# Patient Record
Sex: Male | Born: 1965 | Hispanic: No | Marital: Married | Smoking: Former smoker
Health system: Southern US, Community
[De-identification: ages and names within clinical notes are randomized; demographics above are authoritative.]

## PROBLEM LIST (undated history)

## (undated) DIAGNOSIS — K2270 Barrett's esophagus without dysplasia: Secondary | ICD-10-CM

## (undated) DIAGNOSIS — K449 Diaphragmatic hernia without obstruction or gangrene: Secondary | ICD-10-CM

## (undated) DIAGNOSIS — D126 Benign neoplasm of colon, unspecified: Secondary | ICD-10-CM

## (undated) DIAGNOSIS — K219 Gastro-esophageal reflux disease without esophagitis: Secondary | ICD-10-CM

## (undated) HISTORY — PX: COLONOSCOPY: SHX174

## (undated) HISTORY — PX: VASECTOMY: SHX75

## (undated) HISTORY — DX: Barrett's esophagus without dysplasia: K22.70

## (undated) HISTORY — PX: UPPER GASTROINTESTINAL ENDOSCOPY: SHX188

## (undated) HISTORY — DX: Benign neoplasm of colon, unspecified: D12.6

## (undated) HISTORY — DX: Gastro-esophageal reflux disease without esophagitis: K21.9

## (undated) HISTORY — DX: Diaphragmatic hernia without obstruction or gangrene: K44.9

---

## 2000-05-22 ENCOUNTER — Other Ambulatory Visit: Admission: RE | Admit: 2000-05-22 | Discharge: 2000-05-22 | Payer: Self-pay | Admitting: Urology

## 2002-09-12 ENCOUNTER — Emergency Department (HOSPITAL_COMMUNITY): Admission: EM | Admit: 2002-09-12 | Discharge: 2002-09-12 | Payer: Self-pay | Admitting: *Deleted

## 2006-10-13 ENCOUNTER — Emergency Department (HOSPITAL_COMMUNITY): Admission: EM | Admit: 2006-10-13 | Discharge: 2006-10-13 | Payer: Self-pay | Admitting: Emergency Medicine

## 2007-01-26 ENCOUNTER — Encounter (INDEPENDENT_AMBULATORY_CARE_PROVIDER_SITE_OTHER): Payer: Self-pay | Admitting: *Deleted

## 2007-01-26 ENCOUNTER — Ambulatory Visit (HOSPITAL_COMMUNITY): Admission: RE | Admit: 2007-01-26 | Discharge: 2007-01-26 | Payer: Self-pay | Admitting: Internal Medicine

## 2007-02-02 ENCOUNTER — Encounter: Payer: Self-pay | Admitting: Internal Medicine

## 2007-02-10 ENCOUNTER — Ambulatory Visit (HOSPITAL_COMMUNITY): Admission: RE | Admit: 2007-02-10 | Discharge: 2007-02-10 | Payer: Self-pay | Admitting: *Deleted

## 2007-02-10 ENCOUNTER — Encounter (INDEPENDENT_AMBULATORY_CARE_PROVIDER_SITE_OTHER): Payer: Self-pay | Admitting: *Deleted

## 2007-03-24 ENCOUNTER — Encounter: Payer: Self-pay | Admitting: Internal Medicine

## 2007-04-15 ENCOUNTER — Ambulatory Visit (HOSPITAL_COMMUNITY): Admission: RE | Admit: 2007-04-15 | Discharge: 2007-04-15 | Payer: Self-pay | Admitting: *Deleted

## 2007-04-15 ENCOUNTER — Encounter (INDEPENDENT_AMBULATORY_CARE_PROVIDER_SITE_OTHER): Payer: Self-pay | Admitting: *Deleted

## 2007-05-29 ENCOUNTER — Encounter: Payer: Self-pay | Admitting: Internal Medicine

## 2008-09-08 ENCOUNTER — Encounter: Payer: Self-pay | Admitting: Internal Medicine

## 2009-01-11 DIAGNOSIS — K648 Other hemorrhoids: Secondary | ICD-10-CM | POA: Insufficient documentation

## 2009-01-11 DIAGNOSIS — K449 Diaphragmatic hernia without obstruction or gangrene: Secondary | ICD-10-CM | POA: Insufficient documentation

## 2009-01-11 DIAGNOSIS — Z860100 Personal history of colon polyps, unspecified: Secondary | ICD-10-CM | POA: Insufficient documentation

## 2009-01-11 DIAGNOSIS — K2270 Barrett's esophagus without dysplasia: Secondary | ICD-10-CM | POA: Insufficient documentation

## 2009-01-17 ENCOUNTER — Ambulatory Visit: Payer: Self-pay | Admitting: Internal Medicine

## 2009-01-27 ENCOUNTER — Encounter: Payer: Self-pay | Admitting: Internal Medicine

## 2009-01-31 ENCOUNTER — Ambulatory Visit: Payer: Self-pay | Admitting: Internal Medicine

## 2009-02-04 ENCOUNTER — Encounter: Payer: Self-pay | Admitting: Internal Medicine

## 2009-12-06 ENCOUNTER — Telehealth (INDEPENDENT_AMBULATORY_CARE_PROVIDER_SITE_OTHER): Payer: Self-pay

## 2010-04-17 NOTE — Progress Notes (Signed)
  Phone Note Other Incoming   Request: Send information Summary of Call: Request of records received from [place] of the [employer]. Forwarded to [employer].

## 2010-07-31 NOTE — Op Note (Signed)
NAME:  Tanner Kane, WEINKAUF NO.:  [id number]   MEDICAL RECORD NO.:  [id number]          PATIENT TYPE:  AMB   LOCATION:  ENDO                         FACILITY:  [facility]   PHYSICIAN:  Georgiana Spinner, M.D.    DATE OF BIRTH:  1965/12/10   DATE OF PROCEDURE:  02/10/2007  DATE OF DISCHARGE:                               OPERATIVE REPORT   PROCEDURE:  Upper endoscopy.   INDICATIONS:  Gastroesophageal reflux disease.   ANESTHESIA:  Fentanyl 100 mcg, Versed 10 mg.   PROCEDURE:  With the patient mildly sedated in the left lateral  decubitus position, the Pentax videoscopic endoscope was inserted and  passed under direct vision through the esophagus which appeared normal  until we reached the distal esophagus.  There was an area of Barrett's  photographed and biopsied above a hiatal hernia.  We then entered into  the stomach.  Fundus, body, antrum, duodenal bulb, second portion of the  duodenum were visualized.  From this point the endoscope was slowly  withdrawn taking circumferential views of duodenal mucosa until the  endoscope had been pulled back into stomach and  placed in retroflexion  to view the stomach from below.  The endoscope was straightened and withdrawn taking circumferential  views of the remaining gastric and esophageal mucosa.  The patient's  vital signs and pulse oximeter remained stable.  The patient tolerated  procedure well without apparent complications.   FINDINGS:  Hiatal hernia with Barrett's esophagus and loose wrap of the  GE junction around the endoscope, indicating laxity of the lower  esophageal sphincter.   PLAN:  Continue the patient on PPI therapy.  May want to increase dose  to b.i.d.  Will discuss dietary measures with the patient and wife and  have patient follow up with me as an outpatient.           ______________________________  Georgiana Spinner, M.D.     GMO/MEDQ  D:  02/10/2007  T:  02/10/2007  Job:  [id number]

## 2010-07-31 NOTE — Op Note (Signed)
NAME:  Tanner Kane, Tanner Kane NO.:  [id number]   MEDICAL RECORD NO.:  [id number]          PATIENT TYPE:  AMB   LOCATION:  ENDO                         FACILITY:  [facility]   PHYSICIAN:  Georgiana Spinner, M.D.    DATE OF BIRTH:  [birth year]   DATE OF PROCEDURE:  04/15/2007  DATE OF DISCHARGE:                               OPERATIVE REPORT   PROCEDURE:  Colonoscopy.   INDICATIONS:  Rectal bleeding.   ANESTHESIA:  Fentanyl 125 mcg, Versed 10 mg, Phenergan 25 mg.   PROCEDURE:  With the patient mildly sedated in the left lateral  decubitus position, the Pentax videoscopic colonoscope was inserted in  the rectum after normal rectal exam was performed, passed under direct  vision to the cecum, identified by crow's foot of the cecum and  ileocecal valve, both of which were photographed.  From this point, the  colonoscope was slowly withdrawn taking circumferential views of the  colonic mucosa, stopping first in the transverse colon where a small  polyp was seen, photographed and removed using snare cautery technique  setting of 20/150 blended current.  We next stopped at approximately 20  cm from anal verge at which point a larger polyp was seen with cherry-  red color to it which may very well have been the cause of patient's  bleeding.  This was photographed and this too was removed using snare  cautery technique with the same setting.  It was retrieved for pathology  and a third polyp that was sitting adjacent to this one was removed  using hot biopsy forceps technique with the same setting.  All the  tissue was retrieved for pathology.  The endoscope was withdrawn to the  rectum, which appeared normal on direct and showed hemorrhoids on  retroflexed view.  The endoscope was straightened and withdrawn.  The  patient's vital signs and pulse oximeter remained stable.  The patient  tolerated the procedure well without apparent complications.   FINDINGS:  Internal hemorrhoids.   Polyps at 20 cm from the anal verge  and anal polyp at the transverse colon.   PLAN:  Await biopsy reports.  The patient will call me for results and  follow-up with me as an outpatient as needed.           ______________________________  Georgiana Spinner, M.D.     GMO/MEDQ  D:  04/15/2007  T:  04/15/2007  Job:  [id number]

## 2010-11-11 ENCOUNTER — Other Ambulatory Visit: Payer: Self-pay | Admitting: Internal Medicine

## 2011-02-12 ENCOUNTER — Other Ambulatory Visit: Payer: Self-pay | Admitting: Internal Medicine

## 2011-02-12 ENCOUNTER — Telehealth: Payer: Self-pay | Admitting: Internal Medicine

## 2011-02-12 MED ORDER — PANTOPRAZOLE SODIUM 40 MG PO TBEC: 40.0000 mg | DELAYED_RELEASE_TABLET | Freq: Every day | ORAL | Status: DC

## 2011-02-12 NOTE — Telephone Encounter (Signed)
rx sent

## 2011-02-26 ENCOUNTER — Encounter: Payer: Self-pay | Admitting: *Deleted

## 2011-03-05 ENCOUNTER — Ambulatory Visit (INDEPENDENT_AMBULATORY_CARE_PROVIDER_SITE_OTHER): Payer: BC Managed Care – PPO | Admitting: Internal Medicine

## 2011-03-05 ENCOUNTER — Encounter: Payer: Self-pay | Admitting: Internal Medicine

## 2011-03-05 VITALS — BP 134/76 | HR 60 | Ht 72.0 in | Wt 176.0 lb

## 2011-03-05 DIAGNOSIS — Z860100 Personal history of colon polyps, unspecified: Secondary | ICD-10-CM

## 2011-03-05 DIAGNOSIS — K2270 Barrett's esophagus without dysplasia: Secondary | ICD-10-CM

## 2011-03-05 MED ORDER — PANTOPRAZOLE SODIUM 40 MG PO TBEC: 40.0000 mg | DELAYED_RELEASE_TABLET | Freq: Every day | ORAL | Status: DC

## 2011-03-05 NOTE — Patient Instructions (Addendum)
You will be due for a recall endoscopy/colonoscopy in 03/2012. We will send you a reminder in the mail when it gets closer to that time. We have sent the following medications to your pharmacy for you to pick up at your convenience: Protonix CC: Dr Ricki Miller

## 2011-03-05 NOTE — Progress Notes (Signed)
Tanner Kane 09-02-1965 MRN [id number]    History of Present Illness:  This is a 45 year old white male with gastroesophageal reflux. He is a former patient of Dr. Virginia Rochester, last seen in August 2010. He is here to refill protonix 40 mg daily. He had an upper endoscopy in November 2010 which showed reflux esophagitis. A prior upper endoscopy in 2008 showed intestinal metaplasia consistent with Barrett's esophagus. 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 He currently denies any dysphagia. He takes Protonix 40 mg daily with complete relief of his symptoms. He denies cough or hoarseness. He continues to smoke. There is a personal history of adenomatous polyps of the colon, therefore, he would be due for a repeat colonoscopy in January 2014.   Past Medical History  Diagnosis Date  . Barrett esophagus   . Adenomatous colon polyp   . Hiatal hernia   . GERD (gastroesophageal reflux disease)    Past Surgical History  Procedure Date  . Vasectomy     reports that he has been smoking.  He has never used smokeless tobacco. He reports that he drinks alcohol. He reports that he does not use illicit drugs. family history includes Bone cancer in his maternal grandfather; Breast cancer in his maternal grandfather; and Heart disease in his maternal grandmother.  There is no history of Colon cancer. Allergies  Allergen Reactions  . Aspirin         Review of Systems: Denies chest pain shortness of breath abdominal pain change in bowel habits  The remainder of the 10 point ROS is negative except as outlined in H&P   Physical Exam: General appearance  Well developed, in no distress. Eyes- non icteric. HEENT nontraumatic, normocephalic. Mouth no lesions, tongue papillated, no cheilosis. Neck supple without adenopathy, thyroid not enlarged, no carotid bruits, no JVD. Lungs Clear to auscultation bilaterally. Cor normal S1, normal  S2, regular rhythm, no murmur,  quiet precordium. Abdomen:  Rectal: Extremities no pedal edema. Skin no lesions. Neurological alert and oriented x 3. Psychological normal mood and affect.  Assessment and Plan:   Problem #1 History of chronic gastroesophageal reflux with complete symptomatic relief on Protonix 40 mg daily. Patient has a history of Barrett's esophagus on a 2008 endoscopy but this was not reproduced on a repeat endoscopy in April 2010. We will refill his Protonix and will repeat his upper endoscopy when he is due for colonoscopy in January 2014.  Problem #2 History of adenomatous polyps of the colon on his last colonoscopy in 2009. He will be due for a recall colonoscopy in January 2014.   03/05/2011 Lina Sar

## 2012-01-02 ENCOUNTER — Other Ambulatory Visit: Payer: Self-pay | Admitting: Internal Medicine

## 2012-01-14 ENCOUNTER — Other Ambulatory Visit: Payer: Self-pay | Admitting: Internal Medicine

## 2012-04-09 ENCOUNTER — Other Ambulatory Visit: Payer: Self-pay | Admitting: *Deleted

## 2012-04-09 MED ORDER — PANTOPRAZOLE SODIUM 40 MG PO TBEC: DELAYED_RELEASE_TABLET | ORAL | Status: DC

## 2012-04-17 ENCOUNTER — Encounter: Payer: Self-pay | Admitting: Internal Medicine

## 2012-04-23 ENCOUNTER — Encounter: Payer: Self-pay | Admitting: Internal Medicine

## 2012-05-26 ENCOUNTER — Ambulatory Visit (AMBULATORY_SURGERY_CENTER): Payer: BC Managed Care – PPO | Admitting: *Deleted

## 2012-05-26 VITALS — Ht 73.0 in | Wt 178.4 lb

## 2012-05-26 DIAGNOSIS — Z1211 Encounter for screening for malignant neoplasm of colon: Secondary | ICD-10-CM

## 2012-05-26 DIAGNOSIS — Z86010 Personal history of colonic polyps: Secondary | ICD-10-CM

## 2012-05-26 DIAGNOSIS — K2270 Barrett's esophagus without dysplasia: Secondary | ICD-10-CM

## 2012-05-26 MED ORDER — MOVIPREP 100 G PO SOLR: ORAL | Status: DC

## 2012-05-26 NOTE — Progress Notes (Signed)
Pt states at the beginning of his PV that he thought he was having an EGD only.  I showed him the recall letter sent to him and he calls his wife to check with her.  She has several questions about insurance, so I gave her the number of Karolee Stamps, who handles the insurance.  I prepared him for both procedures and he will call back to decide if he is going to have both procedures or only the EGD.  Understanding voiced

## 2012-05-27 ENCOUNTER — Telehealth: Payer: Self-pay | Admitting: Internal Medicine

## 2012-05-27 ENCOUNTER — Encounter: Payer: Self-pay | Admitting: Internal Medicine

## 2012-05-27 NOTE — Telephone Encounter (Signed)
OK, please cancel his colon so we can use the time on the schedule to add on another procedure. thanx DB

## 2012-05-27 NOTE — Telephone Encounter (Signed)
Left a message for patient to call me. 

## 2012-05-27 NOTE — Telephone Encounter (Signed)
Spoke with patient and he states his insurance will not pay for the endo/colon done together. He wants to do the EGD and then call back to schedule the colonoscopy in a month or two. Colonoscopy removed from his procedure appointment. Note to remind patient in two months, that he should scheduled colonoscopy.

## 2012-05-27 NOTE — Telephone Encounter (Signed)
Changed pt's appt to EGD only per his request.

## 2012-06-09 ENCOUNTER — Encounter: Payer: Self-pay | Admitting: Internal Medicine

## 2012-06-09 ENCOUNTER — Ambulatory Visit (AMBULATORY_SURGERY_CENTER): Payer: BC Managed Care – PPO | Admitting: Internal Medicine

## 2012-06-09 VITALS — BP 124/83 | HR 56 | Temp 97.2°F | Resp 22 | Ht 72.0 in | Wt 175.0 lb

## 2012-06-09 DIAGNOSIS — K209 Esophagitis, unspecified without bleeding: Secondary | ICD-10-CM

## 2012-06-09 DIAGNOSIS — K2270 Barrett's esophagus without dysplasia: Secondary | ICD-10-CM

## 2012-06-09 MED ORDER — SODIUM CHLORIDE 0.9 % IV SOLN: 500.0000 mL | INTRAVENOUS | Status: DC

## 2012-06-09 NOTE — Progress Notes (Signed)
Patient did not experience any of the following events: a burn prior to discharge; a fall within the facility; wrong site/side/patient/procedure/implant event; or a hospital transfer or hospital admission upon discharge from the facility. (G8907) Patient did not have preoperative order for IV antibiotic SSI prophylaxis. (G8918)  

## 2012-06-09 NOTE — Patient Instructions (Addendum)
YOU HAD AN ENDOSCOPIC PROCEDURE TODAY AT THE [facility]: Refer to the procedure report that was given to you for any specific questions about what was found during the examination.  If the procedure report does not answer your questions, please call your gastroenterologist to clarify.  If you requested that your care partner not be given the details of your procedure findings, then the procedure report has been included in a sealed envelope for you to review at your convenience later.  YOU SHOULD EXPECT: Some feelings of bloating in the abdomen. Passage of more gas than usual.  Walking can help get rid of the air that was put into your GI tract during the procedure and reduce the bloating. If you had a lower endoscopy (such as a colonoscopy or flexible sigmoidoscopy) you may notice spotting of blood in your stool or on the toilet paper. If you underwent a bowel prep for your procedure, then you may not have a normal bowel movement for a few days.  DIET: Your first meal following the procedure should be a light meal and then it is ok to progress to your normal diet.  A half-sandwich or bowl of soup is an example of a good first meal.  Heavy or fried foods are harder to digest and may make you feel nauseous or bloated.  Likewise meals heavy in dairy and vegetables can cause extra gas to form and this can also increase the bloating.  Drink plenty of fluids but you should avoid alcoholic beverages for 24 hours.  ACTIVITY: Your care partner should take you home directly after the procedure.  You should plan to take it easy, moving slowly for the rest of the day.  You can resume normal activity the day after the procedure however you should NOT DRIVE or use heavy machinery for 24 hours (because of the sedation medicines used during the test).    SYMPTOMS TO REPORT IMMEDIATELY: A gastroenterologist can be reached at any hour.  During normal business hours, 8:30 AM to 5:00 PM Monday through Friday,  call [phone].  After hours and on weekends, please call the GI answering service at [phone] who will take a message and have the physician on call contact you.    Following upper endoscopy (EGD)  Vomiting of blood or coffee ground material  New chest pain or pain under the shoulder blades  Painful or persistently difficult swallowing  New shortness of breath  Fever of 100F or higher  Black, tarry-looking stools  FOLLOW UP: If any biopsies were taken you will be contacted by phone or by letter within the next 1-3 weeks.  Call your gastroenterologist if you have not heard about the biopsies in 3 weeks.  Our staff will call the home number listed on your records the next business day following your procedure to check on you and address any questions or concerns that you may have at that time regarding the information given to you following your procedure. This is a courtesy call and so if there is no answer at the home number and we have not heard from you through the emergency physician on call, we will assume that you have returned to your regular daily activities without incident.  SIGNATURES/CONFIDENTIALITY: You and/or your care partner have signed paperwork which will be entered into your electronic medical record.  These signatures attest to the fact that that the information above on your After Visit Summary has been reviewed and is understood.  Full   responsibility of the confidentiality of this discharge information lies with you and/or your care-partner.   Hiatal hernia information given.  Await biopsies, continue PPI and anti reflux regimen.

## 2012-06-09 NOTE — Op Note (Signed)
[facility] [address] [place], [address]   ENDOSCOPY PROCEDURE REPORT  PATIENT: Tanner Kane, Tanner Kane.  MR#: [id number] BIRTHDATE: Jul 09, 1965 , 47  yrs. old GENDER: Male ENDOSCOPIST: Hart Carwin, MD REFERRED BY:  recall EGD PROCEDURE DATE:  06/09/2012 PROCEDURE:  EGD w/ biopsy ASA CLASS:     Class I INDICATIONS:  history of Barrett's esophagus.   EGD 2008- Barrett's esophagus, EGD 2011- no Barrett's, Pt taking Protonix daily, has no symptoms. MEDICATIONS: MAC sedation, administered by CRNA and propofol (Diprivan) 200mg  IV TOPICAL ANESTHETIC: none  DESCRIPTION OF PROCEDURE: After the risks benefits and alternatives of the procedure were thoroughly explained, informed consent was obtained.  The LB GIF-H180 [id number] endoscope was introduced through the mouth and advanced to the second portion of the duodenum. Without limitations.  The instrument was slowly withdrawn as the mucosa was fully examined.        ESOPHAGUS: A 2 cm hiatal hernia was noted.   The mucosa of the esophagus appeared normal.  A biopsy was performed.  STOMACH: The mucosa of the stomach appeared normal.  DUODENUM: The duodenal mucosa showed no abnormalities. from g-e junction Retroflexed views revealed no abnormalities.     The scope was then withdrawn from the patient and the procedure completed.  COMPLICATIONS: There were no complications. ENDOSCOPIC IMPRESSION: 1.   2 cm hiatal hernia 2.   The mucosa of the esophagus appeared normal; biopsy from z-line taken 3.   The mucosa of the stomach appeared normal 4.   The duodenal mucosa showed no abnormalities  RECOMMENDATIONS: 1.  Await pathology results 2.  Anti-reflux regimen to be follow 3.  Continue PPI  REPEAT EXAM: for EGD pending biopsy results.  eSigned:  Hart Carwin, MD 06/09/2012 9:05 AM   CC:  PATIENT NAME:  [name]. MR#: [id number]

## 2012-06-10 ENCOUNTER — Telehealth: Payer: Self-pay

## 2012-06-10 NOTE — Telephone Encounter (Signed)
  Follow up Call-  Call back number 06/09/2012  Post procedure Call Back phone  # [phone]  Permission to leave phone message Yes     Patient questions:  Do you have a fever, pain , or abdominal swelling? no Pain Score  0 *  Have you tolerated food without any problems? yes  Have you been able to return to your normal activities? no  Do you have any questions about your discharge instructions: Diet   no Medications  no Follow up visit  no  Do you have questions or concerns about your Care? no  Actions: * If pain score is 4 or above: No action needed, pain <4.

## 2012-06-15 ENCOUNTER — Encounter: Payer: Self-pay | Admitting: Internal Medicine

## 2012-06-16 ENCOUNTER — Encounter: Payer: Self-pay | Admitting: Internal Medicine

## 2012-07-08 ENCOUNTER — Other Ambulatory Visit: Payer: Self-pay | Admitting: Internal Medicine

## 2012-07-29 ENCOUNTER — Ambulatory Visit (AMBULATORY_SURGERY_CENTER): Payer: BC Managed Care – PPO | Admitting: *Deleted

## 2012-07-29 VITALS — Ht 72.0 in | Wt 171.0 lb

## 2012-07-29 DIAGNOSIS — Z1211 Encounter for screening for malignant neoplasm of colon: Secondary | ICD-10-CM

## 2012-07-29 DIAGNOSIS — Z86010 Personal history of colonic polyps: Secondary | ICD-10-CM

## 2012-07-29 MED ORDER — MOVIPREP 100 G PO SOLR: ORAL | Status: DC

## 2012-07-30 ENCOUNTER — Encounter: Payer: Self-pay | Admitting: Internal Medicine

## 2012-08-12 ENCOUNTER — Ambulatory Visit (AMBULATORY_SURGERY_CENTER): Payer: BC Managed Care – PPO | Admitting: Internal Medicine

## 2012-08-12 ENCOUNTER — Encounter: Payer: Self-pay | Admitting: Internal Medicine

## 2012-08-12 VITALS — BP 121/81 | HR 56 | Temp 97.8°F | Resp 12 | Ht 72.0 in | Wt 171.0 lb

## 2012-08-12 DIAGNOSIS — D126 Benign neoplasm of colon, unspecified: Secondary | ICD-10-CM

## 2012-08-12 DIAGNOSIS — Z86010 Personal history of colonic polyps: Secondary | ICD-10-CM

## 2012-08-12 DIAGNOSIS — Z1211 Encounter for screening for malignant neoplasm of colon: Secondary | ICD-10-CM

## 2012-08-12 MED ORDER — SODIUM CHLORIDE 0.9 % IV SOLN: 500.0000 mL | INTRAVENOUS | Status: DC

## 2012-08-12 NOTE — Progress Notes (Signed)
Called to room to assist during endoscopic procedure.  Patient ID and intended procedure confirmed with present staff. Received instructions for my participation in the procedure from the performing physician.  

## 2012-08-12 NOTE — Patient Instructions (Addendum)
Discharge instructions given with verbal understanding. Handout on polyps given. Resume previous medications. YOU HAD AN ENDOSCOPIC PROCEDURE TODAY AT THE [facility]: Refer to the procedure report that was given to you for any specific questions about what was found during the examination.  If the procedure report does not answer your questions, please call your gastroenterologist to clarify.  If you requested that your care partner not be given the details of your procedure findings, then the procedure report has been included in a sealed envelope for you to review at your convenience later.  YOU SHOULD EXPECT: Some feelings of bloating in the abdomen. Passage of more gas than usual.  Walking can help get rid of the air that was put into your GI tract during the procedure and reduce the bloating. If you had a lower endoscopy (such as a colonoscopy or flexible sigmoidoscopy) you may notice spotting of blood in your stool or on the toilet paper. If you underwent a bowel prep for your procedure, then you may not have a normal bowel movement for a few days.  DIET: Your first meal following the procedure should be a light meal and then it is ok to progress to your normal diet.  A half-sandwich or bowl of soup is an example of a good first meal.  Heavy or fried foods are harder to digest and may make you feel nauseous or bloated.  Likewise meals heavy in dairy and vegetables can cause extra gas to form and this can also increase the bloating.  Drink plenty of fluids but you should avoid alcoholic beverages for 24 hours.  ACTIVITY: Your care partner should take you home directly after the procedure.  You should plan to take it easy, moving slowly for the rest of the day.  You can resume normal activity the day after the procedure however you should NOT DRIVE or use heavy machinery for 24 hours (because of the sedation medicines used during the test).    SYMPTOMS TO REPORT IMMEDIATELY: A  gastroenterologist can be reached at any hour.  During normal business hours, 8:30 AM to 5:00 PM Monday through Friday, call [phone].  After hours and on weekends, please call the GI answering service at [phone] who will take a message and have the physician on call contact you.   Following lower endoscopy (colonoscopy or flexible sigmoidoscopy):  Excessive amounts of blood in the stool  Significant tenderness or worsening of abdominal pains  Swelling of the abdomen that is new, acute  Fever of 100F or higher  FOLLOW UP: If any biopsies were taken you will be contacted by phone or by letter within the next 1-3 weeks.  Call your gastroenterologist if you have not heard about the biopsies in 3 weeks.  Our staff will call the home number listed on your records the next business day following your procedure to check on you and address any questions or concerns that you may have at that time regarding the information given to you following your procedure. This is a courtesy call and so if there is no answer at the home number and we have not heard from you through the emergency physician on call, we will assume that you have returned to your regular daily activities without incident.  SIGNATURES/CONFIDENTIALITY: You and/or your care partner have signed paperwork which will be entered into your electronic medical record.  These signatures attest to the fact that that the information above on your After Visit Summary has   been reviewed and is understood.  Full responsibility of the confidentiality of this discharge information lies with you and/or your care-partner. 

## 2012-08-12 NOTE — Progress Notes (Signed)
Patient did not experience any of the following events: a burn prior to discharge; a fall within the facility; wrong site/side/patient/procedure/implant event; or a hospital transfer or hospital admission upon discharge from the facility. (G8907) Patient did not have preoperative order for IV antibiotic SSI prophylaxis. (G8918)  

## 2012-08-12 NOTE — Op Note (Signed)
[facility] [address] [place], [address]   COLONOSCOPY PROCEDURE REPORT  PATIENT: Tanner Kane, Tanner Kane.  MR#: [id number] BIRTHDATE: 1965/03/26 , 47  yrs. old GENDER: Male ENDOSCOPIST: Hart Carwin, MD REFERRED BY:  recall colonoscopy PROCEDURE DATE:  08/12/2012 PROCEDURE:   Colonoscopy with snare polypectomy and Colonoscopy with cold biopsy polypectomy ASA CLASS:   Class II INDICATIONS:Patient's personal history of adenomatous colon polyps and last colon 2010. MEDICATIONS: MAC sedation, administered by CRNA and propofol (Diprivan) 300mg  IV  DESCRIPTION OF PROCEDURE:   After the risks and benefits and of the procedure were explained, informed consent was obtained.  A digital rectal exam revealed no abnormalities of the rectum.    The LB PFC-H190 [id number]  endoscope was introduced through the anus and advanced to the cecum, which was identified by both the appendix and ileocecal valve .  The quality of the prep was excellent, using MoviPrep .  The instrument was then slowly withdrawn as the colon was fully examined.     COLON FINDINGS: Four smooth sessile polyps ranging between 5-68mm in size were found in the descending colon, two at 80 cm, and 2 around 50 cm.  A polypectomy was performed using snare cautery, with a cold snare and with cold forceps.  The resection was complete and the polyp tissue was completely retrieved.     Retroflexed views revealed no abnormalities.     The scope was then withdrawn from the patient and the procedure completed.  COMPLICATIONS: There were no complications. ENDOSCOPIC IMPRESSION: Four sessile polyps ranging between 5-46mm in size were found in the descending colon; polypectomy was performed using snare cautery, with a cold snare and with cold forceps  RECOMMENDATIONS: Await pathology results no ASA x 2 weeks high fiber diet   REPEAT EXAM: In 3 year(s)  for  Colonoscopy.  cc:  _______________________________ eSignedHart Carwin, MD 08/12/2012 10:53 AM     PATIENT NAME:  Tanner Kane. MR#: [id number]

## 2012-08-12 NOTE — Progress Notes (Signed)
1128 atropine .5mg  iv stat decreased hr

## 2012-08-13 ENCOUNTER — Telehealth: Payer: Self-pay | Admitting: *Deleted

## 2012-08-13 NOTE — Telephone Encounter (Signed)
  Follow up Call-  Call back number 08/12/2012 06/09/2012  Post procedure Call Back phone  # [phone]  Permission to leave phone message Yes Yes     Patient questions:  Do you have a fever, pain , or abdominal swelling? no Pain Score  0 *  Have you tolerated food without any problems? yes  Have you been able to return to your normal activities? yes  Do you have any questions about your discharge instructions: Diet   no Medications  no Follow up visit  no  Do you have questions or concerns about your Care? no  Actions: * If pain score is 4 or above: No action needed, pain <4.

## 2012-08-14 ENCOUNTER — Encounter: Payer: Self-pay | Admitting: Internal Medicine

## 2012-08-14 ENCOUNTER — Ambulatory Visit (INDEPENDENT_AMBULATORY_CARE_PROVIDER_SITE_OTHER): Payer: BC Managed Care – PPO | Admitting: Internal Medicine

## 2012-08-14 ENCOUNTER — Other Ambulatory Visit (INDEPENDENT_AMBULATORY_CARE_PROVIDER_SITE_OTHER): Payer: BC Managed Care – PPO

## 2012-08-14 ENCOUNTER — Telehealth: Payer: Self-pay | Admitting: Internal Medicine

## 2012-08-14 VITALS — BP 120/80 | HR 60 | Ht 72.0 in | Wt 171.5 lb

## 2012-08-14 DIAGNOSIS — R1032 Left lower quadrant pain: Secondary | ICD-10-CM

## 2012-08-14 DIAGNOSIS — Z9889 Other specified postprocedural states: Secondary | ICD-10-CM

## 2012-08-14 LAB — CBC WITH DIFFERENTIAL/PLATELET
Basophils Relative: 0.3 % (ref 0.0–3.0)
Eosinophils Absolute: 0.1 10*3/uL (ref 0.0–0.7)
Eosinophils Relative: 0.9 % (ref 0.0–5.0)
Lymphocytes Relative: 22.4 % (ref 12.0–46.0)
Neutrophils Relative %: 67.7 % (ref 43.0–77.0)
RBC: 5.18 Mil/uL (ref 4.22–5.81)
WBC: 9.2 10*3/uL (ref 4.5–10.5)

## 2012-08-14 MED ORDER — CIPROFLOXACIN HCL 250 MG PO TABS: 250.0000 mg | ORAL_TABLET | Freq: Two times a day (BID) | ORAL | Status: DC

## 2012-08-14 NOTE — Patient Instructions (Addendum)
We have sent the following medications to your pharmacy for you to pick up at your convenience: Cipro to take one tablet by mouth twice daily x 5 days.   Please call Dr. Christella Hartigan (on call doctor) if pain gets any worse.

## 2012-08-14 NOTE — Progress Notes (Signed)
Tanner Kane [birth year] MRN [id number]        History of Present Illness:  This is a 47 year old white male who underwent screening colonoscopy for followup of colon polyps on  May 28, 2 days ago, and had 4 colon polyps removed.3 polyps were removed with cold snare and one with hot snare. He developed left  lower quadrant abdominal discomfort yesterday while at work and it  has continued to bother him through the night and especially this morning. He denies any fever. He had a normal bowel movement this morning and had regular meal for lunch. He denies distention. On deep pressure the discomfort gets actually better.   Past Medical History  Diagnosis Date  . Barrett esophagus   . Adenomatous colon polyp   . Hiatal hernia   . GERD (gastroesophageal reflux disease)    Past Surgical History  Procedure Laterality Date  . Vasectomy    . Colonoscopy    . Upper gastrointestinal endoscopy      reports that he has been smoking.  He has never used smokeless tobacco. He reports that  drinks alcohol. He reports that he does not use illicit drugs. family history includes Bone cancer in his maternal grandfather; Breast cancer in his maternal grandfather; and Heart disease in his maternal grandmother.  There is no history of Colon cancer, and Esophageal cancer, and Stomach cancer, and Rectal cancer, . Allergies  Allergen Reactions  . Aspirin Other (See Comments)    "thins blood,nose bleeds"        Review of Systems: Denies rectal bleeding fever nausea vomiting  The remainder of the 10 point ROS is negative except as outlined in H&P   Physical Exam: General appearance  Well developed, in no distress. Eyes- non icteric. HEENT nontraumatic, normocephalic. Mouth no lesions, tongue papillated, no cheilosis. Neck supple without adenopathy, thyroid not enlarged, no carotid bruits, no JVD. Lungs Clear to auscultation bilaterally. Cor normal S1, normal S2, regular rhythm, no murmur,   quiet precordium. Abdomen: Soft abdomen with quiet bowel sounds. No rebound. Minimal tenderness on deep pressure over the left lower quadrant. Rest of the abdomen is unremarkable Rectal: Not repeated Extremities no pedal edema. Skin no lesions. Neurological alert and oriented x 3. Psychological normal mood and affect.  Assessment and Plan:  Post procedure left lower quadrant abdominal discomfort without signs of peritonitis. Patien thad  4 polyps removed from the left colon 48 hours ago. 3 of them with cold snare and 1 with a hot snare. His exam is benign. His white cell count is 9200 and he has been able to eat lunch today. I have advised him to stay on light food and liquids for 24 hours and put him on Cipro 250 mg twice a day.. Have discussed possibility of CT scan of the abdomen to look for inflammatory changes in the left colon but  his exam and symptoms are so mild to that we decided not  To proceed with CT scan and wait instead. He may call Dr. Christella Hartigan who is on call this weekend if pain becomes worse.   08/14/2012 Lina Sar

## 2012-08-14 NOTE — Telephone Encounter (Signed)
Patient had colonoscopy on 08/12/12. He states he was doing fine until yesterday. Started with a sharp, stabbing pain in LLQ of abdomen. Pain has increased today. It is decreased when he pushes on abdomen. Rates pain at 5/6. Denies fever, diarrhea or constipation. He is passing gas. Please, advise.

## 2012-08-14 NOTE — Telephone Encounter (Signed)
Patient scheduled for 2:30 PM. He will come for lab prior to OV.

## 2012-08-14 NOTE — Telephone Encounter (Signed)
He should be seen this afternoon in the office. He had 4 polyps in descending colon. Will need STAT CBC

## 2012-08-17 ENCOUNTER — Encounter: Payer: Self-pay | Admitting: Internal Medicine

## 2012-12-29 ENCOUNTER — Other Ambulatory Visit: Payer: Self-pay | Admitting: Internal Medicine

## 2013-06-24 ENCOUNTER — Other Ambulatory Visit: Payer: Self-pay | Admitting: Internal Medicine

## 2013-12-16 ENCOUNTER — Other Ambulatory Visit: Payer: Self-pay | Admitting: Internal Medicine

## 2014-06-17 ENCOUNTER — Other Ambulatory Visit: Payer: Self-pay | Admitting: Internal Medicine

## 2014-06-17 NOTE — Telephone Encounter (Signed)
Sent Rx for pantoprazole (PROTONIX), 40 mg, #90 with one refill to [facility] in [place], [place] on 06/17/14.

## 2014-12-07 ENCOUNTER — Emergency Department: Payer: BLUE CROSS/BLUE SHIELD

## 2014-12-07 ENCOUNTER — Emergency Department
Admission: EM | Admit: 2014-12-07 | Discharge: 2014-12-07 | Disposition: A | Discharge status: Home / Self Care | Payer: BLUE CROSS/BLUE SHIELD | Attending: Emergency Medicine | Admitting: Emergency Medicine

## 2014-12-07 ENCOUNTER — Encounter: Payer: Self-pay | Admitting: *Deleted

## 2014-12-07 DIAGNOSIS — R001 Bradycardia, unspecified: Secondary | ICD-10-CM | POA: Insufficient documentation

## 2014-12-07 DIAGNOSIS — J159 Unspecified bacterial pneumonia: Secondary | ICD-10-CM | POA: Diagnosis not present

## 2014-12-07 DIAGNOSIS — Z720 Tobacco use: Secondary | ICD-10-CM | POA: Diagnosis not present

## 2014-12-07 DIAGNOSIS — R079 Chest pain, unspecified: Secondary | ICD-10-CM | POA: Diagnosis present

## 2014-12-07 DIAGNOSIS — Z79899 Other long term (current) drug therapy: Secondary | ICD-10-CM | POA: Diagnosis not present

## 2014-12-07 DIAGNOSIS — J189 Pneumonia, unspecified organism: Secondary | ICD-10-CM

## 2014-12-07 LAB — COMPREHENSIVE METABOLIC PANEL
ALK PHOS: 89 U/L (ref 38–126)
ALT: 19 U/L (ref 17–63)
ANION GAP: 9 (ref 5–15)
AST: 18 U/L (ref 15–41)
Albumin: 4.2 g/dL (ref 3.5–5.0)
BILIRUBIN TOTAL: 0.6 mg/dL (ref 0.3–1.2)
BUN: 10 mg/dL (ref 6–20)
CALCIUM: 9.5 mg/dL (ref 8.9–10.3)
CO2: 27 mmol/L (ref 22–32)
Chloride: 104 mmol/L (ref 101–111)
Creatinine, Ser: 0.79 mg/dL (ref 0.61–1.24)
GFR calc Af Amer: >60 mL/min (ref >60)
GFR calc non Af Amer: >60 mL/min (ref >60)
Glucose, Bld: 69 mg/dL (ref 65–99)
Potassium: 3.6 mmol/L (ref 3.5–5.1)
Sodium: 140 mmol/L (ref 135–145)
TOTAL PROTEIN: 7.4 g/dL (ref 6.5–8.1)

## 2014-12-07 LAB — CBC
HCT: 43.6 % (ref 40.0–52.0)
Hemoglobin: 15 g/dL (ref 13.0–18.0)
MCH: 31.6 pg (ref 26.0–34.0)
MCHC: 34.4 g/dL (ref 32.0–36.0)
MCV: 91.6 fL (ref 80.0–100.0)
PLATELETS: 354 10*3/uL (ref 150–440)
RBC: 4.76 MIL/uL (ref 4.40–5.90)
RDW: 12.6 % (ref 11.5–14.5)
WBC: 7.6 10*3/uL (ref 3.8–10.6)

## 2014-12-07 LAB — URINALYSIS COMPLETE WITH MICROSCOPIC (ARMC ONLY)
Bacteria, UA: NONE SEEN
Bilirubin Urine: NEGATIVE
Glucose, UA: NEGATIVE mg/dL
Hgb urine dipstick: NEGATIVE
Ketones, ur: NEGATIVE mg/dL
Leukocytes, UA: NEGATIVE
NITRITE: NEGATIVE
PROTEIN: NEGATIVE mg/dL
Specific Gravity, Urine: 1.011 (ref 1.005–1.030)
Squamous Epithelial / LPF: NONE SEEN
pH: 6 (ref 5.0–8.0)

## 2014-12-07 LAB — TROPONIN I: Troponin I: <0.03 ng/mL (ref <0.031)

## 2014-12-07 MED ORDER — IBUPROFEN 800 MG PO TABS: 800.0000 mg | ORAL_TABLET | Freq: Three times a day (TID) | ORAL | Status: AC | PRN

## 2014-12-07 MED ORDER — OXYCODONE-ACETAMINOPHEN 5-325 MG PO TABS
1.0000 | ORAL_TABLET | Freq: Once | ORAL | Status: AC
  Administered 2014-12-07: 1 via ORAL
  Filled 2014-12-07: qty 1

## 2014-12-07 MED ORDER — LEVOFLOXACIN 750 MG PO TABS
750.0000 mg | ORAL_TABLET | Freq: Once | ORAL | Status: AC
  Administered 2014-12-07: 750 mg via ORAL
  Filled 2014-12-07: qty 1

## 2014-12-07 MED ORDER — LEVOFLOXACIN 750 MG PO TABS: 750.0000 mg | ORAL_TABLET | Freq: Once | ORAL | Status: DC

## 2014-12-07 MED ORDER — OXYCODONE-ACETAMINOPHEN 5-325 MG PO TABS: 1.0000 | ORAL_TABLET | ORAL | Status: DC | PRN

## 2014-12-07 NOTE — ED Provider Notes (Signed)
[facility] Emergency Department Provider Note  ____________________________________________  Time seen: Approximately 8:10 PM  I have reviewed the triage vital signs and the nursing notes.   HISTORY  Chief Complaint Chest Pain    HPI Tanner Kane is a 49 y.o. male with ongoing tobacco use presenting with right lateral chest pain. Patient reports that for the past 3 weeks he has had a cough. Initially it was productive but now it is dry. Over the past several days she's been having right lateral chest wall pain which is positional and worse with coughing. He denies any left-sided chest pain, shortness of breath, palpitations, syncope. Last week he was having general malaise and generalized weakness, but those symptoms have improved and he has been able to continue to work. His wife had similar cough and cold symptoms 2 or 3 weeks ago. He denies any recent fevers, chills, or urinary symptoms. He saw his PCP for concern for urinary tract infection due to dark urine but no pain with urination last week; UA was reportedly negative for infection.Patient also reports 20 pound weight loss due to anorexia although his appetite is now improving.   Past Medical History  Diagnosis Date  . Barrett esophagus   . Adenomatous colon polyp   . Hiatal hernia   . GERD (gastroesophageal reflux disease)     Patient Active Problem List   Diagnosis Date Noted  . HEMORRHOIDS, INTERNAL 01/11/2009  . BARRETTS ESOPHAGUS 01/11/2009  . HIATAL HERNIA 01/11/2009  . COLONIC POLYPS, ADENOMATOUS, HX OF 01/11/2009    Past Surgical History  Procedure Laterality Date  . Vasectomy    . Colonoscopy    . Upper gastrointestinal endoscopy      Current Outpatient Rx  Name  Route  Sig  Dispense  Refill  . ibuprofen (ADVIL,MOTRIN) 800 MG tablet   Oral   Take 1 tablet (800 mg total) by mouth every 8 (eight) hours as needed (with food).   20 tablet   0   . levofloxacin (LEVAQUIN) 750  MG tablet   Oral   Take 1 tablet (750 mg total) by mouth once.   14 tablet   0   . oxyCODONE-acetaminophen (PERCOCET/ROXICET) 5-325 MG per tablet   Oral   Take 1 tablet by mouth every 4 (four) hours as needed for severe pain.   20 tablet   0   . pantoprazole (PROTONIX) 40 MG tablet      TAKE 1 TABLET (40 MG TOTAL) BY MOUTH DAILY.   90 tablet   1     Allergies Aspirin  Family History  Problem Relation Age of Onset  . Breast cancer Maternal Grandfather   . Bone cancer Maternal Grandfather   . Heart disease Maternal Grandmother   . Colon cancer Neg Hx   . Esophageal cancer Neg Hx   . Stomach cancer Neg Hx   . Rectal cancer Neg Hx     Social History Social History  Substance Use Topics  . Smoking status: Current Every Day Smoker -- 0.50 packs/day  . Smokeless tobacco: Never Used  . Alcohol Use: Yes     Comment: 3-4 drinks daily    Review of Systems Constitutional: No fever/chills. Positive general malaise and generalized weakness. Eyes: No visual changes. ENT: No sore throat. Cardiovascular: Positive chest pain, without palpitations. Respiratory: Denies shortness of breath. Nonproductive cough. Gastrointestinal: No abdominal pain.  No nausea, no vomiting.  No diarrhea.  No constipation. Genitourinary: Negative for dysuria. Musculoskeletal: Negative for  back pain. Skin: Negative for rash. Neurological: Negative for headaches, focal weakness or numbness.  10-point ROS otherwise negative.  ____________________________________________   PHYSICAL EXAM:  VITAL SIGNS: ED Triage Vitals  Enc Vitals Group     BP 12/07/14 1850 128/81 mmHg     Pulse Rate 12/07/14 1850 71     Resp 12/07/14 1850 18     Temp 12/07/14 1850 98.2 F (36.8 C)     Temp Source 12/07/14 1850 Oral     SpO2 12/07/14 1850 97 %     Weight 12/07/14 1850 148 lb (67.132 kg)     Height 12/07/14 1850 6' (1.829 m)     Head Cir --      Peak Flow --      Pain Score 12/07/14 1858 0     Pain Loc  --      Pain Edu? --      Excl. in [place]? --     Constitutional: Alert and oriented. Well appearing and in no acute distress. Answer question appropriately. Eyes: Conjunctivae are normal.  EOMI. Head: Atraumatic. Nose: No congestion/rhinnorhea. Mouth/Throat: Mucous membranes are moist.  Neck: No stridor.  Supple.   Cardiovascular: Normal rate, regular rhythm. No murmurs, rubs or gallops.  Respiratory: Normal respiratory effort.  No retractions. Lungs CTAB.  No wheezes, rales or ronchi. Prolonged expiratory phase. Gastrointestinal: Soft and nontender. No distention. No peritoneal signs. Musculoskeletal: No LE edema. No palpable cords or Homans sign. Neurologic:  Normal speech and language. No gross focal neurologic deficits are appreciated.  Skin:  Skin is warm, dry and intact. No rash noted. Psychiatric: Mood and affect are normal. Speech and behavior are normal.  Normal judgement.  ____________________________________________   LABS (all labs ordered are listed, but only abnormal results are displayed)  Labs Reviewed  URINALYSIS COMPLETEWITH MICROSCOPIC (National) - Abnormal; Notable for the following:    Color, Urine YELLOW (*)    APPearance CLEAR (*)    All other components within normal limits  COMPREHENSIVE METABOLIC PANEL  CBC  TROPONIN I   ____________________________________________  EKG  ED ECG REPORT I, Eula Listen, the attending physician, personally viewed and interpreted this ECG.   Date: 12/07/2014  EKG Time: 1911  Rate: 56  Rhythm: sinus bradycardia  Axis: Leftward  Intervals:none  ST&T Change:  nonspecific T-wave inversion of V1.  ____________________________________________  RADIOLOGY  Dg Chest 2 View  12/07/2014   CLINICAL DATA:  Hervey Ard RIGHT lower chest pain for 7 days with cough, difficulty taking a deep breath, GERD, hiatal hernia, smoker  EXAM: CHEST  2 VIEW  COMPARISON:  None  FINDINGS: Normal heart size, mediastinal contours, and  pulmonary vascularity.  Minimal atherosclerotic calcification aorta.  Emphysematous and minimal bronchitic changes consistent with COPD.  Medial RIGHT middle lobe infiltrate consistent with pneumonia.  Remaining lungs clear.  No pleural effusion or pneumothorax.  Osseous structures unremarkable.  IMPRESSION: Medial RIGHT middle lobe infiltrate consistent with pneumonia.   Electronically Signed   By: Lavonia Dana M.D.   On: 12/07/2014 19:25    ____________________________________________   PROCEDURES  Procedure(s) performed: None  Critical Care performed: No ____________________________________________   INITIAL IMPRESSION / ASSESSMENT AND PLAN / ED COURSE  Pertinent labs & imaging results that were available during my care of the patient were reviewed by me and considered in my medical decision making (see chart for details).  49 y.o. male with greater than one pack per day 10 days smoking history presenting with 3 weeks of cough,  now with right lateral chest wall pain. The patient denies shortness of breath and has an oxygen saturation of 98% at rest. He is afebrile with sinus bradycardia. His chest x-ray from triage to show a right middle lobe infiltrate. I will initiate his antibiotics here as the most likely etiology is pneumonia. I have counseled him on reevaluation after his antibiotics given his smoking history and concern for possible underlying mass. PE is very unlikely. Will get ambulatory pulse ox and if he is able to maintain his saturations plan discharge with oral antibiotics for community-acquired pneumonia.  Patient was able to ambulate without significant change in his respiratory rate, and sats remained in the mid to high 90s. He was discharged on oral medications and will follow up with his primary care physician.  ____________________________________________  FINAL CLINICAL IMPRESSION(S) / ED DIAGNOSES  Final diagnoses:  Community acquired pneumonia      NEW  MEDICATIONS STARTED DURING THIS VISIT:  Discharge Medication List as of 12/07/2014  8:25 PM       Eula Listen, MD 12/07/14 2339

## 2014-12-07 NOTE — ED Notes (Signed)
Patient states he saw his doctor for possible UTI and cough on last Friday. Patient c/o right lower rib pain that is worse with coughs, burps or hiccups or taking a deep breath. Patient had a chest x-ray, blood test and urine test at [facility] and is supposed to see him again for results on Friday. Patient states he didn't want to wait till Friday because rib pain is getting worse.

## 2014-12-07 NOTE — Discharge Instructions (Signed)
Please drink plenty of fluids to stay well hydrated. Please take the entire course of antibiotics even if you're feeling better. Please make a follow-up appointment primary care physician for reevaluation after antibiotics are completed.  Please return to the emergency department if you develop shortness of breath, chest pain, fever, inability to keep down fluids, or any other symptoms concerning to you.

## 2014-12-07 NOTE — ED Notes (Signed)
Saturation 98% on room air while ambulating, Dr. Mariea Clonts made aware.

## 2014-12-12 ENCOUNTER — Telehealth: Payer: Self-pay | Admitting: Emergency Medicine

## 2014-12-12 NOTE — ED Notes (Signed)
Patient called asking for note that says he can work.  Says he has been working /has not missed work, but his HR department found out that he came to the ED and that he has pneumonia--so now they want a note saying he can work.  I explained that if he was not given a note saying he needed to be out of work, then he could work.  i told him I could give a note from that day that says he can work and leave it at the front desk.  He will pick it up.

## 2015-02-22 ENCOUNTER — Encounter: Payer: Self-pay | Admitting: Internal Medicine

## 2015-12-21 ENCOUNTER — Emergency Department
Admission: EM | Admit: 2015-12-21 | Discharge: 2015-12-21 | Disposition: A | Discharge status: Home / Self Care | Payer: Managed Care, Other (non HMO) | Attending: Emergency Medicine | Admitting: Emergency Medicine

## 2015-12-21 ENCOUNTER — Encounter: Payer: Self-pay | Admitting: Emergency Medicine

## 2015-12-21 ENCOUNTER — Emergency Department: Payer: Managed Care, Other (non HMO)

## 2015-12-21 DIAGNOSIS — F17200 Nicotine dependence, unspecified, uncomplicated: Secondary | ICD-10-CM | POA: Diagnosis not present

## 2015-12-21 DIAGNOSIS — R1012 Left upper quadrant pain: Secondary | ICD-10-CM | POA: Diagnosis present

## 2015-12-21 DIAGNOSIS — R109 Unspecified abdominal pain: Secondary | ICD-10-CM

## 2015-12-21 LAB — CBC
HCT: 48.6 % (ref 40.0–52.0)
HEMOGLOBIN: 17 g/dL (ref 13.0–18.0)
MCH: 31.8 pg (ref 26.0–34.0)
MCHC: 35 g/dL (ref 32.0–36.0)
MCV: 90.9 fL (ref 80.0–100.0)
PLATELETS: 272 10*3/uL (ref 150–440)
RBC: 5.35 MIL/uL (ref 4.40–5.90)
RDW: 12.9 % (ref 11.5–14.5)
WBC: 7.9 10*3/uL (ref 3.8–10.6)

## 2015-12-21 LAB — URINALYSIS COMPLETE WITH MICROSCOPIC (ARMC ONLY)
BILIRUBIN URINE: NEGATIVE
Bacteria, UA: NONE SEEN
Glucose, UA: NEGATIVE mg/dL
HGB URINE DIPSTICK: NEGATIVE
KETONES UR: NEGATIVE mg/dL
LEUKOCYTES UA: NEGATIVE
Nitrite: NEGATIVE
PH: 6 (ref 5.0–8.0)
PROTEIN: NEGATIVE mg/dL
Specific Gravity, Urine: 1.017 (ref 1.005–1.030)

## 2015-12-21 LAB — COMPREHENSIVE METABOLIC PANEL
ALBUMIN: 4.4 g/dL (ref 3.5–5.0)
ALT: 23 U/L (ref 17–63)
ANION GAP: 7 (ref 5–15)
AST: 20 U/L (ref 15–41)
Alkaline Phosphatase: 94 U/L (ref 38–126)
BUN: 15 mg/dL (ref 6–20)
CALCIUM: 9.5 mg/dL (ref 8.9–10.3)
CHLORIDE: 104 mmol/L (ref 101–111)
CO2: 27 mmol/L (ref 22–32)
CREATININE: 1.09 mg/dL (ref 0.61–1.24)
Glucose, Bld: 96 mg/dL (ref 65–99)
Potassium: 4.1 mmol/L (ref 3.5–5.1)
SODIUM: 138 mmol/L (ref 135–145)
Total Bilirubin: 0.9 mg/dL (ref 0.3–1.2)
Total Protein: 7.7 g/dL (ref 6.5–8.1)

## 2015-12-21 LAB — LIPASE, BLOOD: LIPASE: 36 U/L (ref 11–51)

## 2015-12-21 MED ORDER — CARISOPRODOL 350 MG PO TABS: 350.0000 mg | ORAL_TABLET | Freq: Three times a day (TID) | ORAL | 0 refills | Status: AC | PRN

## 2015-12-21 NOTE — ED Notes (Signed)
Pt c/o left side rib pain that started x2days ago, painful to cough or strain. Pt describes that pain today feels like " something inside my stomach pushing against my rib cage." Pt denies n/v/d. Pt denies fevers at home. Pt states painful when coughing and pressure with ventilation. Pt in NAD at this time

## 2015-12-21 NOTE — ED Notes (Signed)
Pt refuses vs at d/c

## 2015-12-21 NOTE — ED Triage Notes (Signed)
Pt to ed with c/o abd pain x 3 days,  Constant pain today, denies n/v/d.

## 2015-12-21 NOTE — ED Provider Notes (Signed)
[facility] Emergency Department Provider Note   ____________________________________________   First MD Initiated Contact with Patient 12/21/15 1747     (approximate)  I have reviewed the triage vital signs and the nursing notes.   HISTORY  Chief Complaint Abdominal Pain   HPI Tanner Kane is a 50 y.o. male with a history of colon polyps as well as a Barrett's esophagus and hiatal hernia who is presenting with left upper abdominal pain. He says the pain has been ongoing for 3 days and worsened yesterday when he tipped over a wheelbarrow. He says the pain worsens with movement and with coughing. He denies any pain with deep breathing to his chest. Denies any shortness of breath. Denies any nausea vomiting or diarrhea. He has not taken anything for pain control at home and says that he does not usually like to take pain medications. Denies any burning with urination. Denies any blood in his urine. Denies any history of kidney stones. Says that he is a rum and Coke and nightly but otherwise does not drink. Says that the pain is "excruciating" when he moves but arrest is a 3 out of 10. He says that prior to today the pain was intermittent but over the course of today has been constant.   Past Medical History:  Diagnosis Date  . Adenomatous colon polyp   . Barrett esophagus   . GERD (gastroesophageal reflux disease)   . Hiatal hernia     Patient Active Problem List   Diagnosis Date Noted  . HEMORRHOIDS, INTERNAL 01/11/2009  . BARRETTS ESOPHAGUS 01/11/2009  . HIATAL HERNIA 01/11/2009  . COLONIC POLYPS, ADENOMATOUS, HX OF 01/11/2009    Past Surgical History:  Procedure Laterality Date  . COLONOSCOPY    . UPPER GASTROINTESTINAL ENDOSCOPY    . VASECTOMY      Prior to Admission medications   Medication Sig Start Date End Date Taking? Authorizing Provider  ibuprofen (ADVIL,MOTRIN) 800 MG tablet Take 1 tablet (800 mg total) by mouth every 8 (eight)  hours as needed (with food). 12/07/14   Anne-Caroline Mariea Clonts, MD  levofloxacin (LEVAQUIN) 750 MG tablet Take 1 tablet (750 mg total) by mouth once. 12/07/14   Eula Listen, MD  oxyCODONE-acetaminophen (PERCOCET/ROXICET) 5-325 MG per tablet Take 1 tablet by mouth every 4 (four) hours as needed for severe pain. 12/07/14   Anne-Caroline Mariea Clonts, MD  pantoprazole (PROTONIX) 40 MG tablet TAKE 1 TABLET (40 MG TOTAL) BY MOUTH DAILY. 06/17/14   Lafayette Dragon, MD    Allergies Aspirin  Family History  Problem Relation Age of Onset  . Breast cancer Maternal Grandfather   . Bone cancer Maternal Grandfather   . Heart disease Maternal Grandmother   . Colon cancer Neg Hx   . Esophageal cancer Neg Hx   . Stomach cancer Neg Hx   . Rectal cancer Neg Hx     Social History Social History  Substance Use Topics  . Smoking status: Current Every Day Smoker    Packs/day: 0.50  . Smokeless tobacco: Never Used  . Alcohol use Yes     Comment: 3-4 drinks daily    Review of Systems Constitutional: No fever/chills Eyes: No visual changes. ENT: No sore throat. Cardiovascular: Denies chest pain. Respiratory: Denies shortness of breath. Gastrointestinal: No nausea, no vomiting.  No diarrhea.  No constipation. Genitourinary: Negative for dysuria. Musculoskeletal: Negative for back pain. Skin: Negative for rash. Neurological: Negative for headaches, focal weakness or numbness.  10-point ROS otherwise negative.  ____________________________________________   PHYSICAL EXAM:  VITAL SIGNS: ED Triage Vitals  Enc Vitals Group     BP 12/21/15 1453 131/78     Pulse Rate 12/21/15 1453 71     Resp 12/21/15 1453 17     Temp 12/21/15 1453 98.4 F (36.9 C)     Temp Source 12/21/15 1453 Oral     SpO2 12/21/15 1453 94 %     Weight 12/21/15 1447 148 lb (67.1 kg)     Height 12/21/15 1454 6' (1.829 m)     Head Circumference --      Peak Flow --      Pain Score 12/21/15 1741 2     Pain Loc --      Pain  Edu? --      Excl. in [place]? --     Constitutional: Alert and oriented. Well appearing and in no acute distress. Eyes: Conjunctivae are normal. PERRL. EOMI. Head: Atraumatic. Nose: No congestion/rhinnorhea. Mouth/Throat: Mucous membranes are moist.   Neck: No stridor.   Cardiovascular: Normal rate, regular rhythm. Grossly normal heart sounds.  Respiratory: Normal respiratory effort.  No retractions. Lungs CTAB. Gastrointestinal: Soft with tenderness to the left upper quadrant along the costal margin of ribs 9 through 11.  No distention.  Musculoskeletal: No lower extremity tenderness nor edema.  No joint effusions. Neurologic:  Normal speech and language. No gross focal neurologic deficits are appreciated. No gait instability. Skin:  Skin is warm, dry and intact. No rash noted. Psychiatric: Mood and affect are normal. Speech and behavior are normal.  ____________________________________________   LABS (all labs ordered are listed, but only abnormal results are displayed)  Labs Reviewed  URINALYSIS COMPLETEWITH MICROSCOPIC ([place]) - Abnormal; Notable for the following:       Result Value   Color, Urine YELLOW (*)    APPearance CLEAR (*)    Squamous Epithelial / LPF 0-5 (*)    All other components within normal limits  LIPASE, BLOOD  COMPREHENSIVE METABOLIC PANEL  CBC   ____________________________________________  EKG   ____________________________________________  RADIOLOGY  CT RENAL STONE STUDY (Accession [id number]) (Order [id number])  Imaging  Date: 12/21/2015 Department: [facility] EMERGENCY DEPARTMENT Released By/Authorizing: Orbie Pyo, MD (auto-released)  Exam Information   Status Exam Begun  Exam Ended   Final [99] 12/21/2015 6:43 PM 12/21/2015 6:46 PM  PACS Images   Show images for CT RENAL STONE STUDY  Study Result   CLINICAL DATA:  Post left-sided rib pain through left flank pain starting 2 days ago. Painful with  cough and straining.  EXAM: CT ABDOMEN AND PELVIS WITHOUT CONTRAST  TECHNIQUE: Multidetector CT imaging of the abdomen and pelvis was performed following the standard protocol without IV contrast.  COMPARISON:  None.  FINDINGS: LOWER CHEST: Lung bases are clear. The visualized heart size is normal. No pericardial effusion.  HEPATOBILIARY: The unenhanced liver is unremarkable. The gallbladder is contracted and there may be tiny dependent gravel noted within, but this is not conclusive. No ductal dilatation.  PANCREAS: Normal.  SPLEEN: Normal.  ADRENALS/URINARY TRACT: Kidneys are orthotopic, demonstrating normal size and morphology. 1 mm calcification in the interpolar left kidney series 2, image 17 without evidence of obstructive uropathy on either side ; limited assessment for renal masses on this nonenhanced examination. The unopacified ureters are normal in course and caliber. Urinary bladder is partially distended and unremarkable. Normal adrenal glands.  STOMACH/BOWEL: The stomach, small and large bowel are normal in course and caliber without  inflammatory changes, the sensitivity may be decreased by lack of enteric contrast. Normal appendix.  VASCULAR/LYMPHATIC: Aortoiliac vessels are normal in course and caliber. Atherosclerotic calcifications noted within the aorta, both common and internal iliacs. No lymphadenopathy by CT size criteria.  REPRODUCTIVE: Central zone calcifications of the top-normal size prostate.  OTHER: No intraperitoneal free fluid or free air.  MUSCULOSKELETAL: Degenerative disc disease L5-S1 with vacuum disc phenomenon. No acute osseous abnormality.  IMPRESSION: 1 mm interpolar left renal calculus without obstructive uropathy.   Electronically Signed   By: Ashley Royalty M.D.   On: 12/21/2015 19:01     ____________________________________________   PROCEDURES  Procedure(s) performed:   Procedures  Critical Care  performed:   ____________________________________________   INITIAL IMPRESSION / ASSESSMENT AND PLAN / ED COURSE  Pertinent labs & imaging results that were available during my care of the patient were reviewed by me and considered in my medical decision making (see chart for details).  ----------------------------------------- 7:41 PM on 12/21/2015 -----------------------------------------  Patient without any outward signs of pain at this time. Very small kidney stone to the left kidney with non-obstructive findings. Repeated palpation and tender again over the lower ribs on the left side upon direct pressure to the ribs. Very reassuring scan as far as the lower chest. No pathology noted in the bones or lung tissue. Patient without any pleuritic chest pain with deep breathing. Denies any shortness of breath or chest pain. Very unlikely to be PE. Worse with movement and started at the time where he stretched his arms out to tip over a wheelbarrow. Suspecting musculoskeletal pain. I discussed the imaging results as well as my suspected diagnosis with the patient and his family who is at the bedside. I recommended that he take ibuprofen as well as a muscle relaxer. I'll be prescribing him Soma. I also recommended a salve such as icy hot or Aspercreme. He'll be discharged home to follow up with his primary care doctor.  Clinical Course     ____________________________________________   FINAL CLINICAL IMPRESSION(S) / ED DIAGNOSES  Final diagnoses:  Left flank pain      NEW MEDICATIONS STARTED DURING THIS VISIT:  New Prescriptions   No medications on file     Note:  This document was prepared using Dragon voice recognition software and may include unintentional dictation errors.    Orbie Pyo, MD 12/21/15 [id number]

## 2016-08-13 DIAGNOSIS — D225 Melanocytic nevi of trunk: Secondary | ICD-10-CM | POA: Diagnosis not present

## 2016-08-13 DIAGNOSIS — L821 Other seborrheic keratosis: Secondary | ICD-10-CM | POA: Diagnosis not present

## 2017-03-28 IMAGING — CT CT RENAL STONE PROTOCOL
2 of 4 series · 16 of 46 positions shown, 18 images · non-contrast
Comparison: None.

CLINICAL DATA: Post left-sided rib pain through left flank pain
starting 2 days ago. Painful with cough and straining.

EXAM:
CT ABDOMEN AND PELVIS WITHOUT CONTRAST
TECHNIQUE: Multidetector CT imaging of the abdomen and pelvis was performed
following the standard protocol without IV contrast.

[Series 2: axial st · axial · 0.79mm/px · z∈[-872,-488]mm · 13 of 85 slices shown, 15 images]
[im 4/85  soft-tissue]
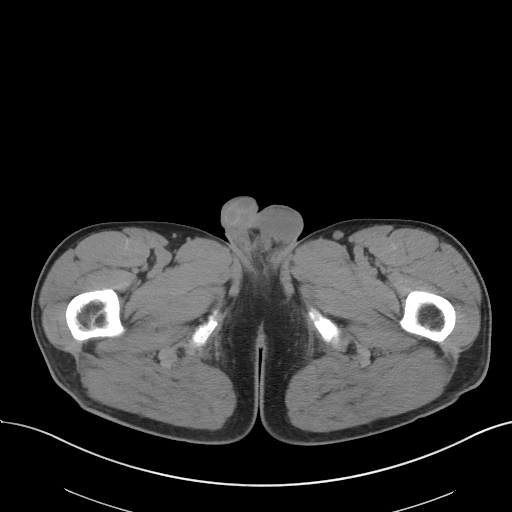
[im 4/85  bone]
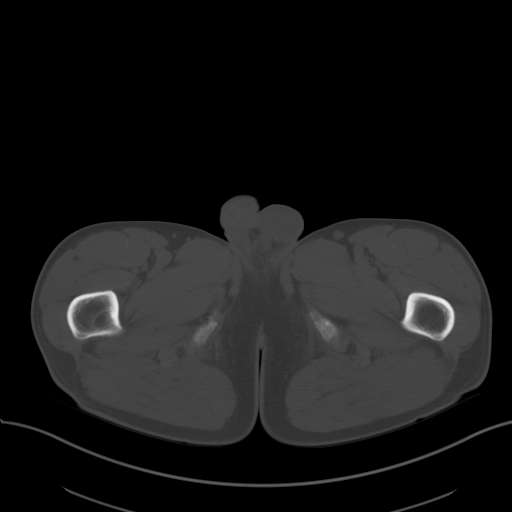
[im 11/85  soft-tissue]
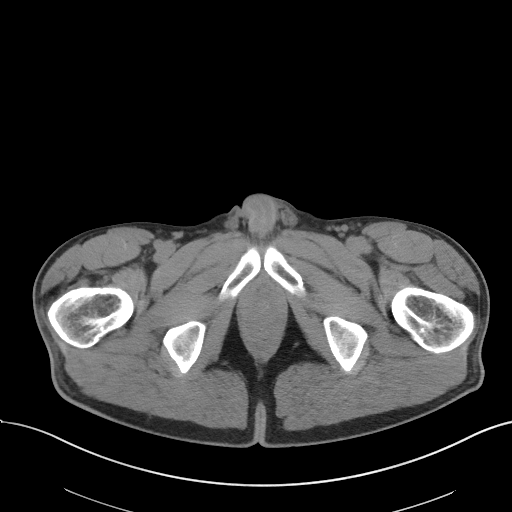
[im 17/85  soft-tissue]
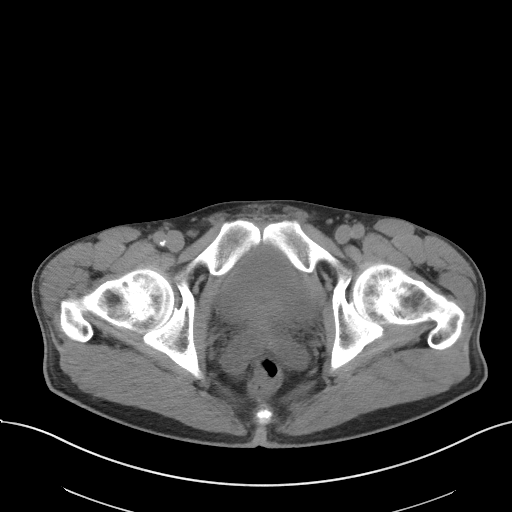
[im 24/85  soft-tissue]
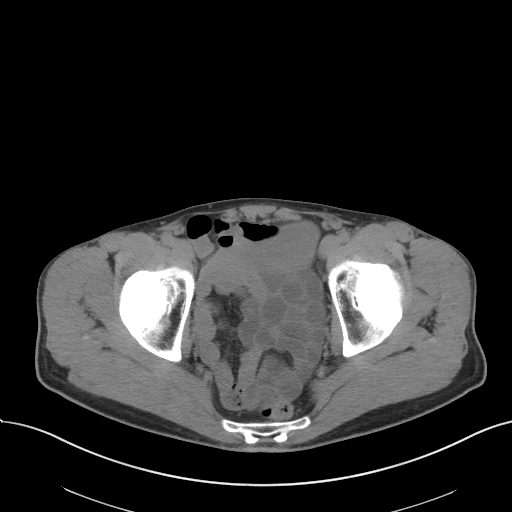
[im 31/85  soft-tissue]
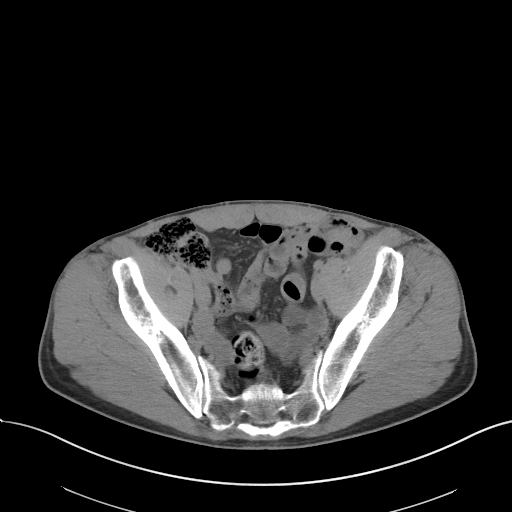
[im 37/85  soft-tissue]
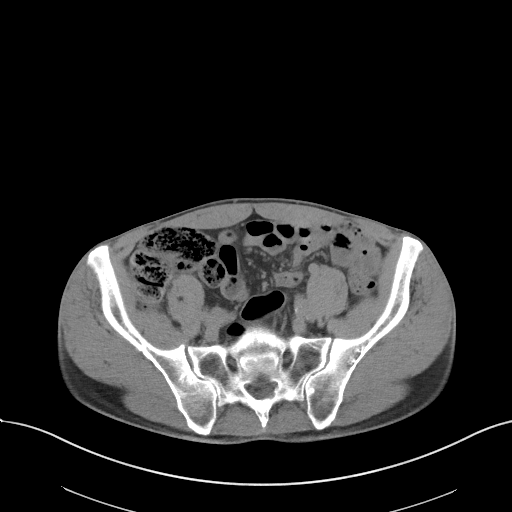
[im 44/85  soft-tissue]
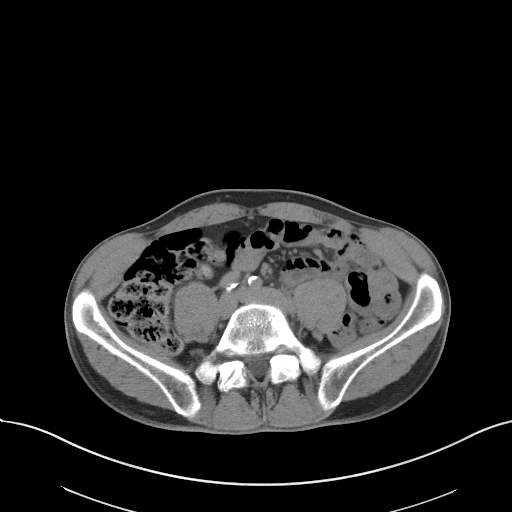
[im 48/85  soft-tissue]
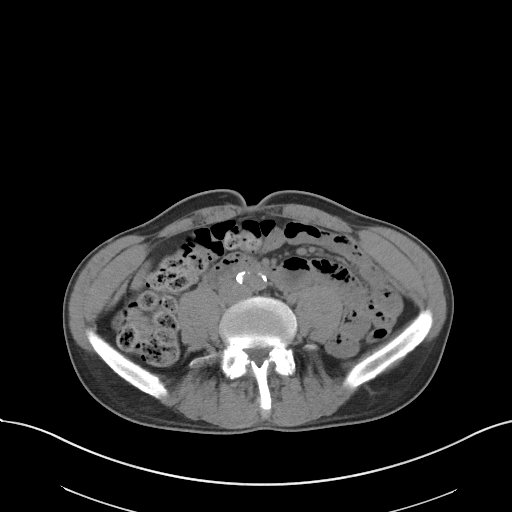
[im 54/85  soft-tissue]
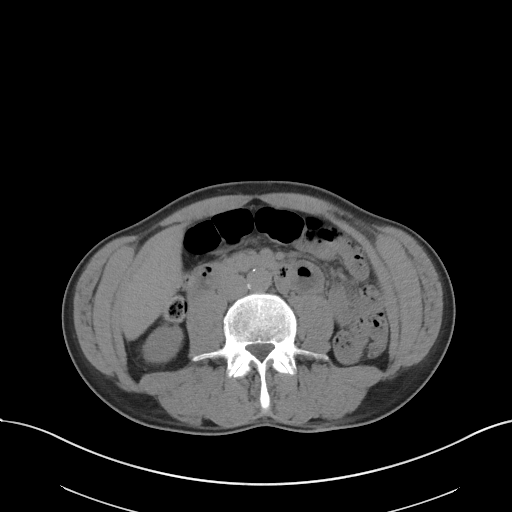
[im 54/85  bone]
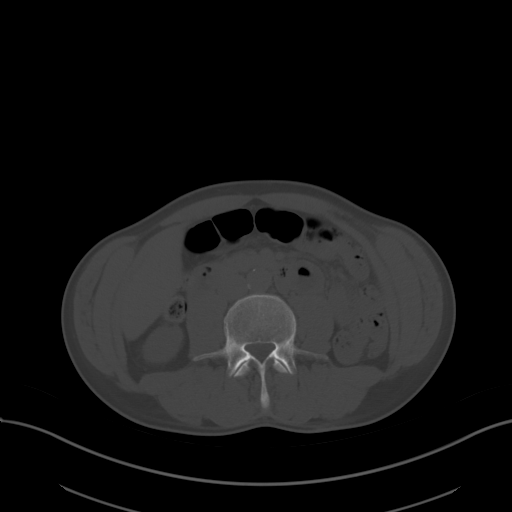
[im 61/85  soft-tissue]
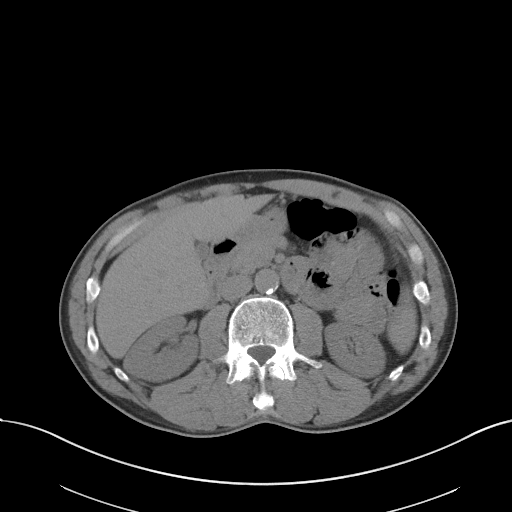
[im 68/85  soft-tissue]
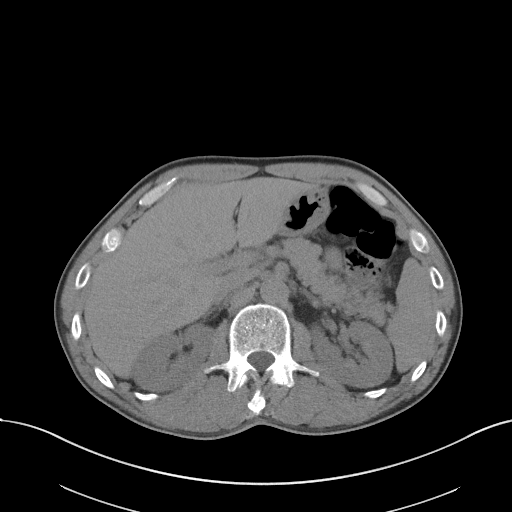
[im 74/85  soft-tissue]
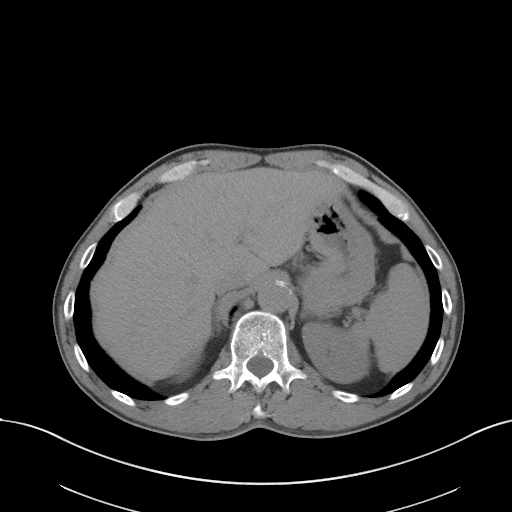
[im 81/85  soft-tissue]
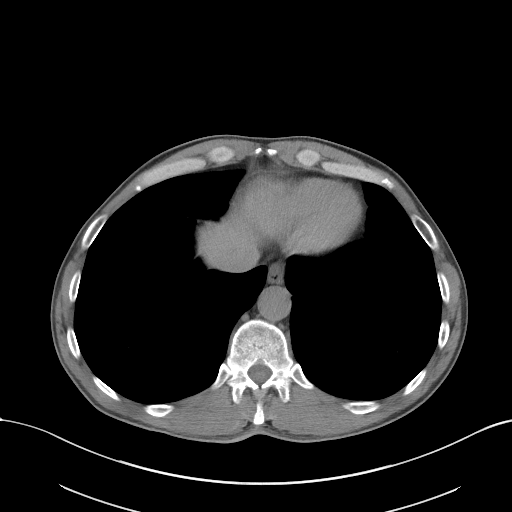

[Series 5: coronal · coronal · 0.60mm/px · 3 of 115 slices shown]
[im 39/115  soft-tissue]
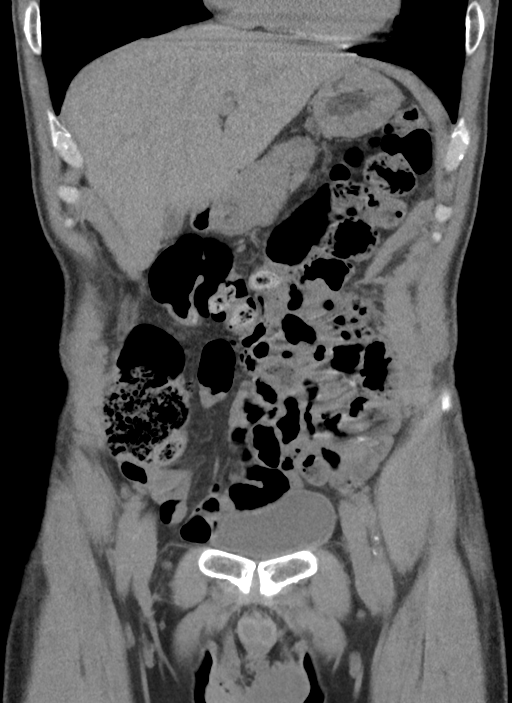
[im 51/115  soft-tissue]
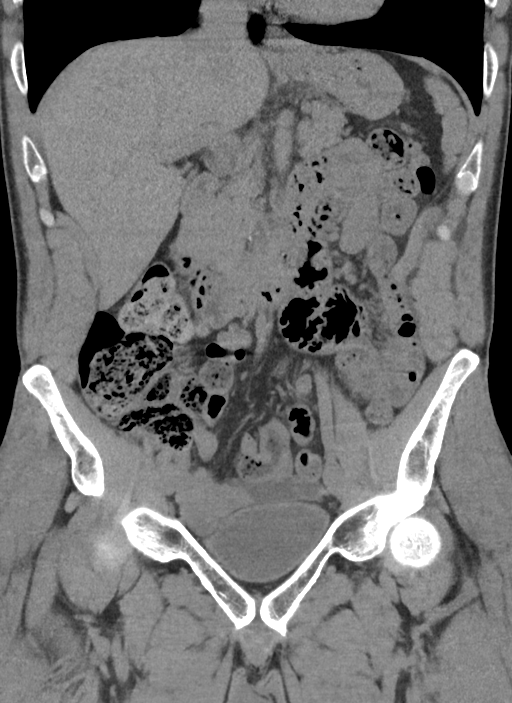
[im 64/115  soft-tissue]
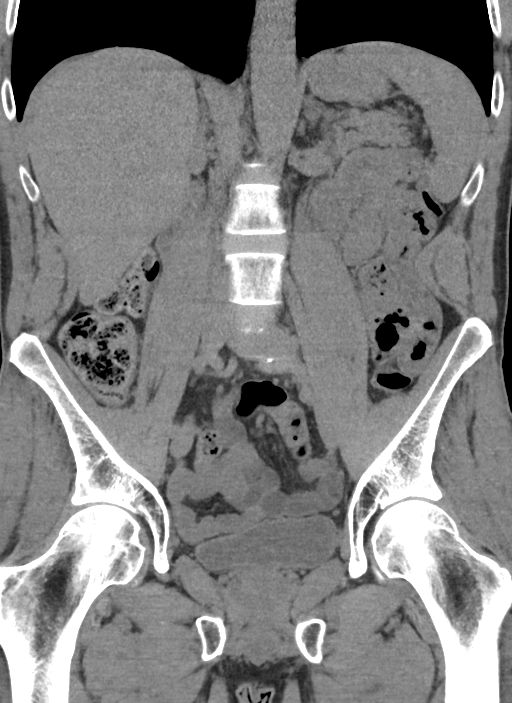

[16 of 46 positions shown; findings below may reference images not displayed]

FINDINGS: LOWER CHEST: Lung bases are clear. The visualized heart size is
normal. No pericardial effusion.

HEPATOBILIARY: The unenhanced liver is unremarkable. The gallbladder
is contracted and there may be tiny dependent gravel noted within,
but this is not conclusive. No ductal dilatation.

PANCREAS: Normal.

SPLEEN: Normal.

ADRENALS/URINARY TRACT: Kidneys are orthotopic, demonstrating normal
size and morphology. 1 mm calcification in the interpolar left
kidney series 2, image 17 without evidence of obstructive uropathy
on either side ; limited assessment for renal masses on this
nonenhanced examination. The unopacified ureters are normal in
course and caliber. Urinary bladder is partially distended and
unremarkable. Normal adrenal glands.

STOMACH/BOWEL: The stomach, small and large bowel are normal in
course and caliber without inflammatory changes, the sensitivity may
be decreased by lack of enteric contrast. Normal appendix.

VASCULAR/LYMPHATIC: Aortoiliac vessels are normal in course and
caliber. Atherosclerotic calcifications noted within the aorta, both
common and internal iliacs. No lymphadenopathy by CT size criteria.

REPRODUCTIVE: Central zone calcifications of the top-normal size
prostate.

OTHER: No intraperitoneal free fluid or free air.

MUSCULOSKELETAL: Degenerative disc disease L5-S1 with vacuum disc
phenomenon. No acute osseous abnormality.
IMPRESSION: 1 mm interpolar left renal calculus without obstructive uropathy.

## 2017-06-01 ENCOUNTER — Encounter: Payer: Self-pay | Admitting: Gastroenterology

## 2017-07-02 DIAGNOSIS — Z125 Encounter for screening for malignant neoplasm of prostate: Secondary | ICD-10-CM | POA: Diagnosis not present

## 2017-07-02 DIAGNOSIS — Z131 Encounter for screening for diabetes mellitus: Secondary | ICD-10-CM | POA: Diagnosis not present

## 2017-07-02 DIAGNOSIS — Z0000 Encounter for general adult medical examination without abnormal findings: Secondary | ICD-10-CM | POA: Diagnosis not present

## 2017-07-02 DIAGNOSIS — R5383 Other fatigue: Secondary | ICD-10-CM | POA: Diagnosis not present

## 2017-07-09 DIAGNOSIS — R9431 Abnormal electrocardiogram [ECG] [EKG]: Secondary | ICD-10-CM | POA: Diagnosis not present

## 2017-07-09 DIAGNOSIS — Z0000 Encounter for general adult medical examination without abnormal findings: Secondary | ICD-10-CM | POA: Diagnosis not present

## 2017-07-09 DIAGNOSIS — K22719 Barrett's esophagus with dysplasia, unspecified: Secondary | ICD-10-CM | POA: Diagnosis not present

## 2017-12-02 DIAGNOSIS — H5203 Hypermetropia, bilateral: Secondary | ICD-10-CM | POA: Diagnosis not present

## 2017-12-02 DIAGNOSIS — H524 Presbyopia: Secondary | ICD-10-CM | POA: Diagnosis not present

## 2018-07-22 DIAGNOSIS — Z131 Encounter for screening for diabetes mellitus: Secondary | ICD-10-CM | POA: Diagnosis not present

## 2018-07-22 DIAGNOSIS — Z125 Encounter for screening for malignant neoplasm of prostate: Secondary | ICD-10-CM | POA: Diagnosis not present

## 2018-07-22 DIAGNOSIS — Z0000 Encounter for general adult medical examination without abnormal findings: Secondary | ICD-10-CM | POA: Diagnosis not present

## 2018-07-29 DIAGNOSIS — Z0000 Encounter for general adult medical examination without abnormal findings: Secondary | ICD-10-CM | POA: Diagnosis not present

## 2018-07-29 DIAGNOSIS — K22719 Barrett's esophagus with dysplasia, unspecified: Secondary | ICD-10-CM | POA: Diagnosis not present

## 2018-07-29 DIAGNOSIS — R9431 Abnormal electrocardiogram [ECG] [EKG]: Secondary | ICD-10-CM | POA: Diagnosis not present

## 2018-07-29 DIAGNOSIS — Z7189 Other specified counseling: Secondary | ICD-10-CM | POA: Diagnosis not present

## 2018-12-02 DIAGNOSIS — Z23 Encounter for immunization: Secondary | ICD-10-CM | POA: Diagnosis not present

## 2019-08-10 DIAGNOSIS — Z125 Encounter for screening for malignant neoplasm of prostate: Secondary | ICD-10-CM | POA: Diagnosis not present

## 2019-08-10 DIAGNOSIS — Z13220 Encounter for screening for lipoid disorders: Secondary | ICD-10-CM | POA: Diagnosis not present

## 2019-08-10 DIAGNOSIS — Z0000 Encounter for general adult medical examination without abnormal findings: Secondary | ICD-10-CM | POA: Diagnosis not present

## 2019-08-17 DIAGNOSIS — Z87891 Personal history of nicotine dependence: Secondary | ICD-10-CM | POA: Diagnosis not present

## 2019-08-17 DIAGNOSIS — R9431 Abnormal electrocardiogram [ECG] [EKG]: Secondary | ICD-10-CM | POA: Diagnosis not present

## 2019-08-17 DIAGNOSIS — Z0000 Encounter for general adult medical examination without abnormal findings: Secondary | ICD-10-CM | POA: Diagnosis not present

## 2019-08-17 DIAGNOSIS — D751 Secondary polycythemia: Secondary | ICD-10-CM | POA: Diagnosis not present

## 2019-08-18 ENCOUNTER — Other Ambulatory Visit: Payer: Self-pay | Admitting: Internal Medicine

## 2019-08-18 DIAGNOSIS — Z87891 Personal history of nicotine dependence: Secondary | ICD-10-CM

## 2019-09-01 ENCOUNTER — Ambulatory Visit
Admission: RE | Admit: 2019-09-01 | Discharge: 2019-09-01 | Disposition: A | Discharge status: Home / Self Care | Payer: BC Managed Care – PPO | Source: Ambulatory Visit | Attending: Internal Medicine | Admitting: Internal Medicine

## 2019-09-01 DIAGNOSIS — Z87891 Personal history of nicotine dependence: Secondary | ICD-10-CM

## 2019-09-01 DIAGNOSIS — S2231XK Fracture of one rib, right side, subsequent encounter for fracture with nonunion: Secondary | ICD-10-CM | POA: Diagnosis not present

## 2019-12-10 DIAGNOSIS — Z23 Encounter for immunization: Secondary | ICD-10-CM | POA: Diagnosis not present

## 2020-08-22 DIAGNOSIS — Z125 Encounter for screening for malignant neoplasm of prostate: Secondary | ICD-10-CM | POA: Diagnosis not present

## 2020-08-22 DIAGNOSIS — Z0000 Encounter for general adult medical examination without abnormal findings: Secondary | ICD-10-CM | POA: Diagnosis not present

## 2020-08-29 DIAGNOSIS — D751 Secondary polycythemia: Secondary | ICD-10-CM | POA: Diagnosis not present

## 2020-08-29 DIAGNOSIS — Z0000 Encounter for general adult medical examination without abnormal findings: Secondary | ICD-10-CM | POA: Diagnosis not present

## 2020-08-29 DIAGNOSIS — Z87891 Personal history of nicotine dependence: Secondary | ICD-10-CM | POA: Diagnosis not present

## 2020-08-29 DIAGNOSIS — R9431 Abnormal electrocardiogram [ECG] [EKG]: Secondary | ICD-10-CM | POA: Diagnosis not present

## 2020-12-07 IMAGING — CT CT CHEST W/O CM
2 of 4 series · 12 of 36 positions shown, 15 images · non-contrast
Comparison: None.

CLINICAL DATA: Right-sided chest pain.

EXAM:
CT CHEST WITHOUT CONTRAST
TECHNIQUE: Multidetector CT imaging of the chest was performed following the
standard protocol without IV contrast.

[Series 2: chest 2.00 br40 s3 · axial · 0.52mm/px · z∈[+1435,+1755]mm · 9 of 190 slices shown, 12 images (1 of 2)]
[im 15/190  mediastinal]
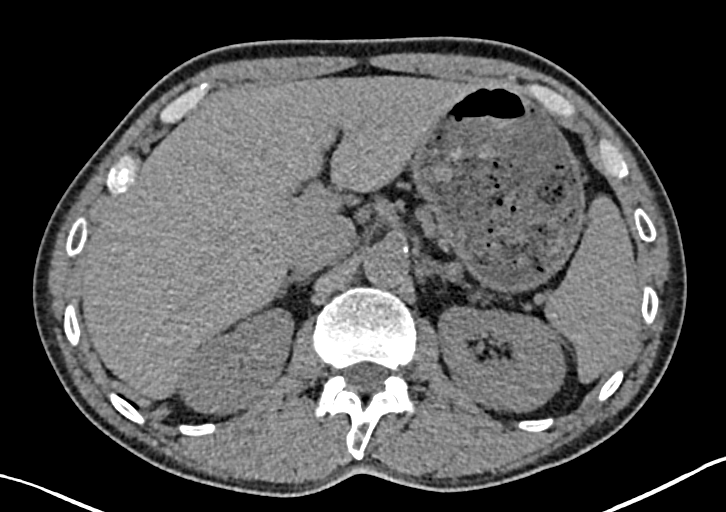
[im 15/190  lung]
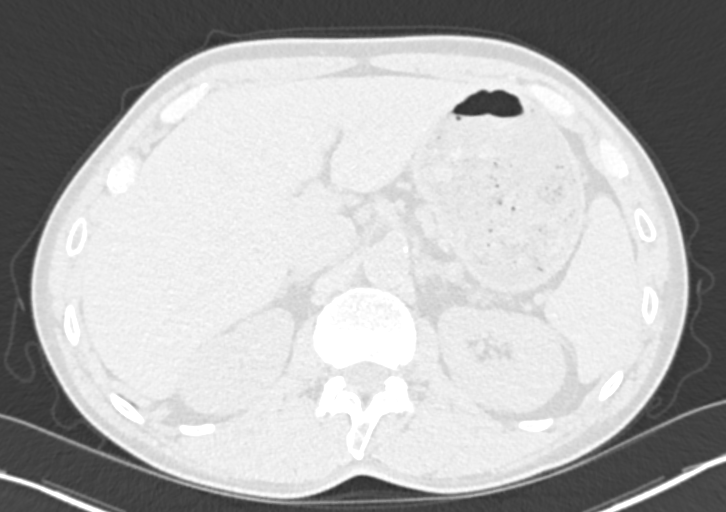
[im 44/190  lung]
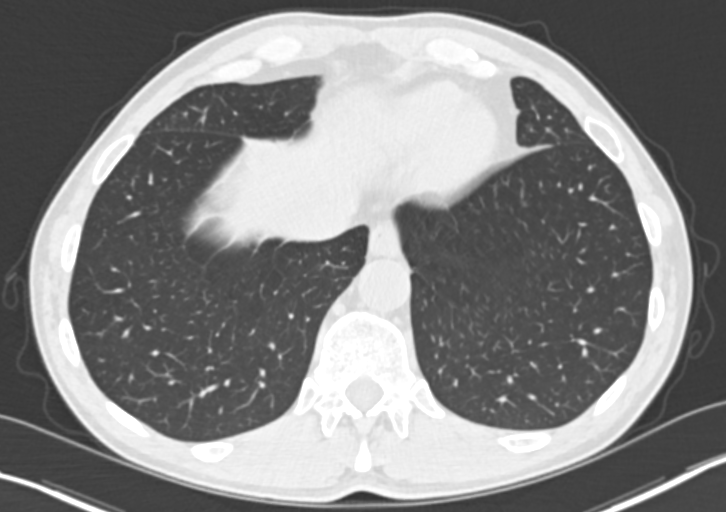
[im 59/190  lung]
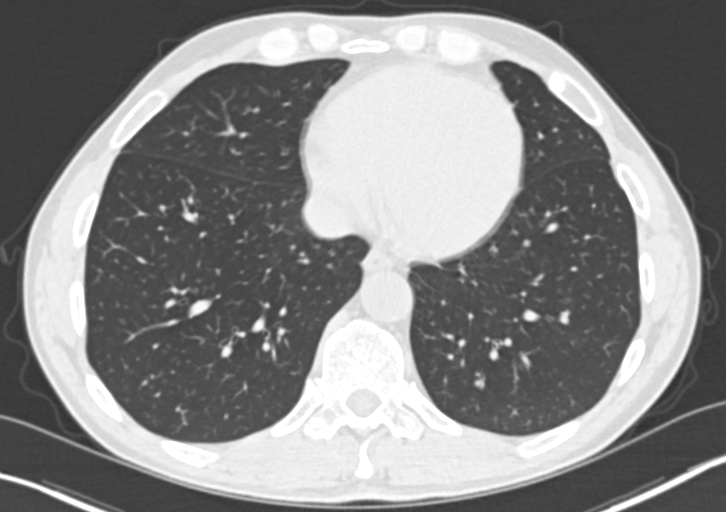
[im 73/190  lung]
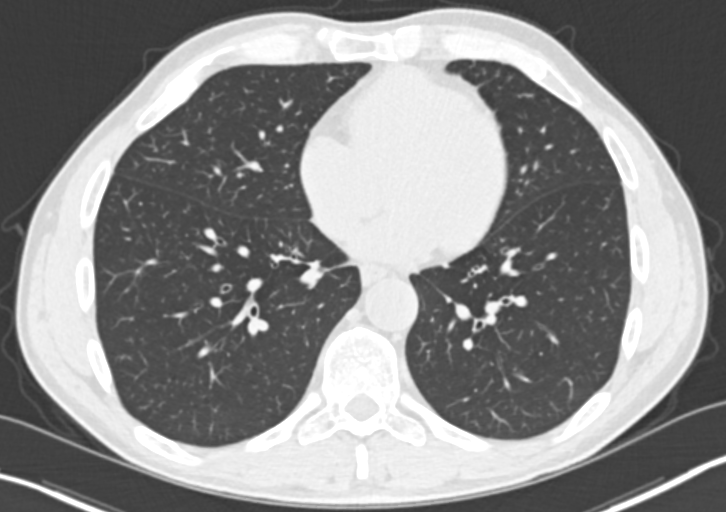
[im 102/190  mediastinal]
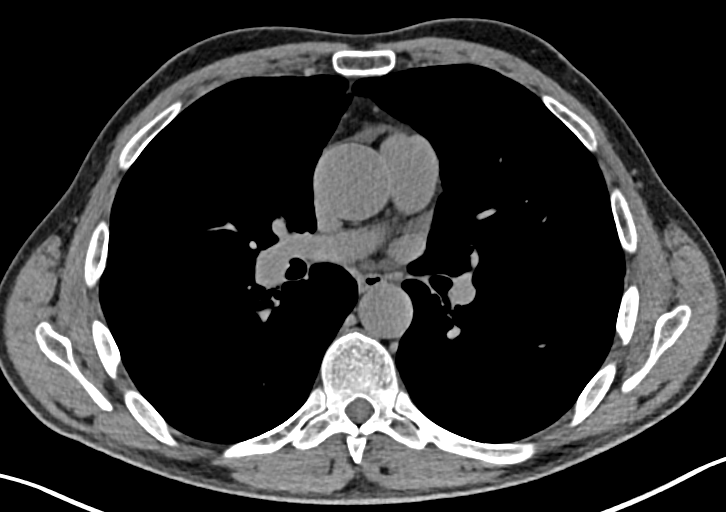
[im 102/190  lung]
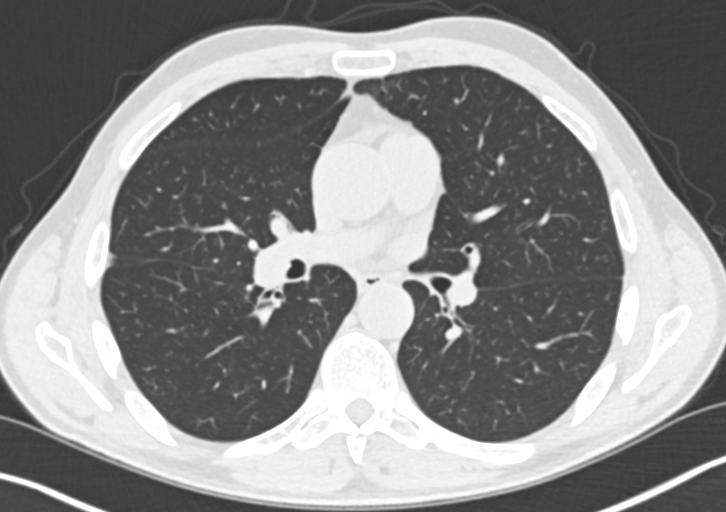
[im 117/190  lung]
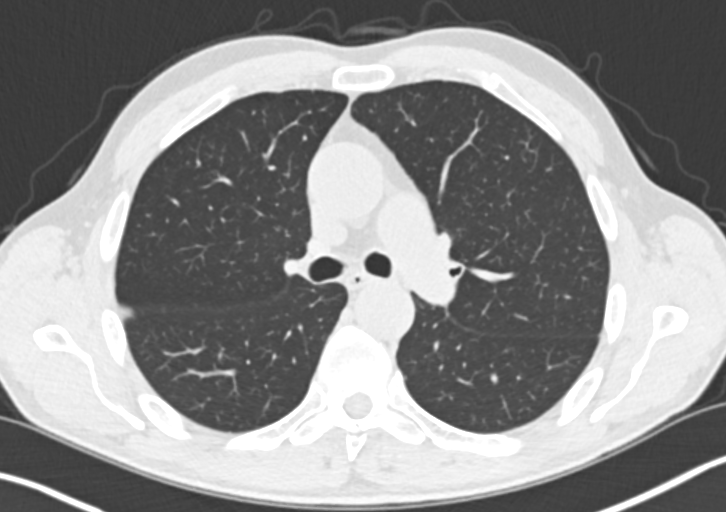
[im 131/190  lung]
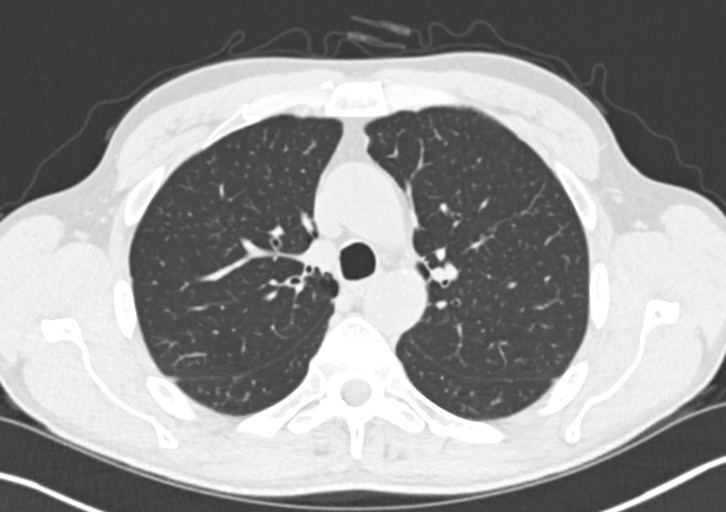
[im 160/190  lung]
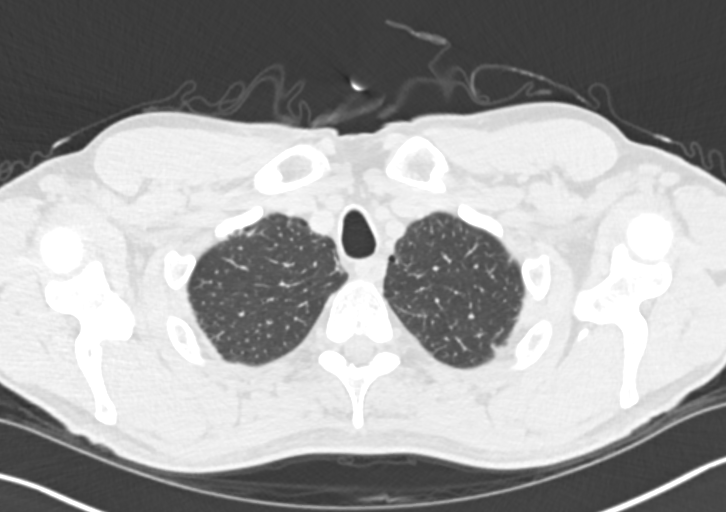
[im 175/190  mediastinal]
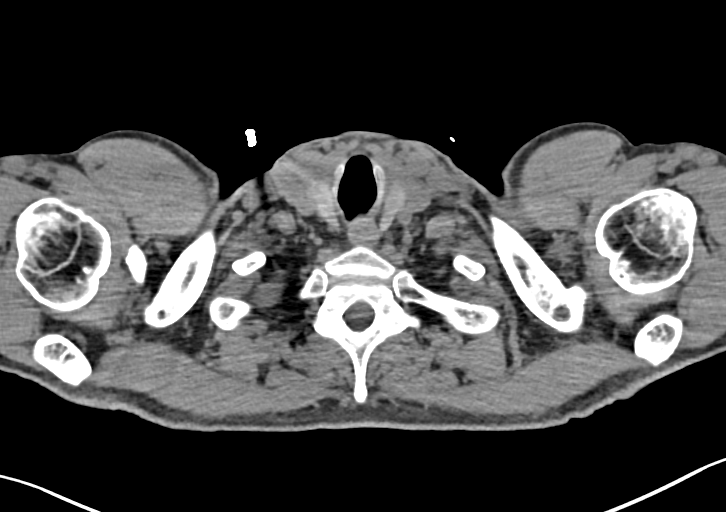
[im 175/190  lung]
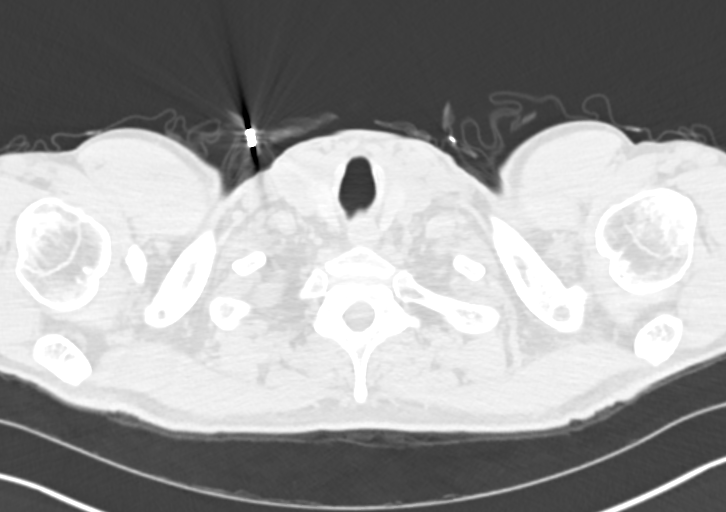

[Series 4: chest 2.00 br40 s3 · coronal · 0.73mm/px · 3 of 131 slices shown (2 of 2)]
[im 27/131  lung]
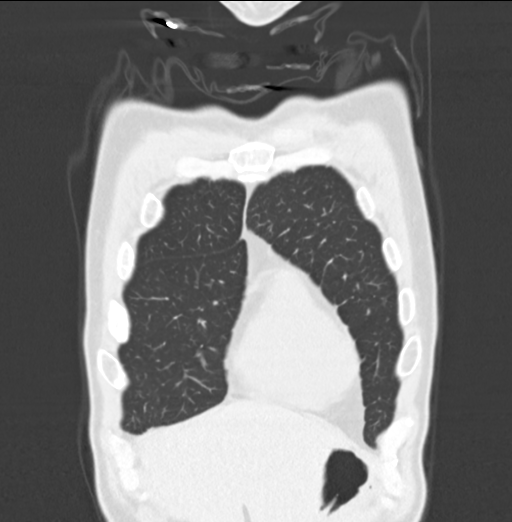
[im 53/131  lung]
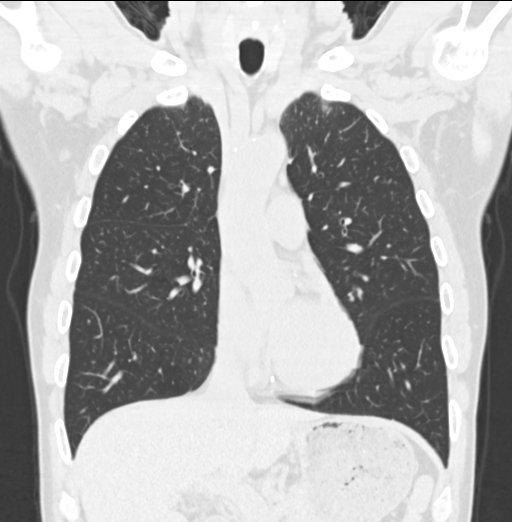
[im 79/131  lung]
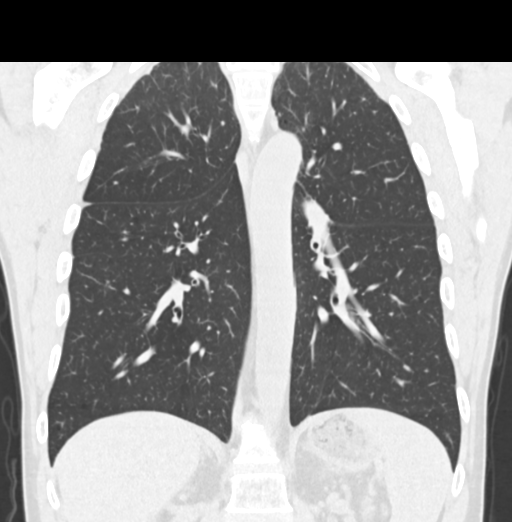

[12 of 36 positions shown; findings below may reference images not displayed]

FINDINGS: Cardiovascular: The heart size is normal. No substantial pericardial
effusion. Coronary artery calcification is evident. Atherosclerotic
calcification is noted in the wall of the thoracic aorta. Ascending
thoracic aorta measures 4 cm diameter.

Mediastinum/Nodes: No mediastinal lymphadenopathy. No evidence for
gross hilar lymphadenopathy although assessment is limited by the
lack of intravenous contrast on today's study. The esophagus has
normal imaging features. There is no axillary lymphadenopathy.

Lungs/Pleura: Centrilobular and paraseptal emphysema evident.
Biapical pleuroparenchymal scarring evident. No suspicious pulmonary
nodule or mass. No focal airspace consolidation. No pleural
effusion.

Upper Abdomen: Unremarkable

Musculoskeletal: No worrisome lytic or sclerotic osseous
abnormality. Nonacute fracture nonunion anterior right fifth rib.
IMPRESSION: 1. Nonacute fracture nonunion anterior right fifth rib.
2. Aortic Atherosclerosis (3PHUY-0L9.9) and Emphysema (3PHUY-M6J.0).

## 2021-09-05 DIAGNOSIS — R9431 Abnormal electrocardiogram [ECG] [EKG]: Secondary | ICD-10-CM | POA: Diagnosis not present

## 2021-09-05 DIAGNOSIS — Z0000 Encounter for general adult medical examination without abnormal findings: Secondary | ICD-10-CM | POA: Diagnosis not present

## 2021-09-05 DIAGNOSIS — Z125 Encounter for screening for malignant neoplasm of prostate: Secondary | ICD-10-CM | POA: Diagnosis not present

## 2021-09-05 DIAGNOSIS — Z87891 Personal history of nicotine dependence: Secondary | ICD-10-CM | POA: Diagnosis not present

## 2021-09-05 DIAGNOSIS — D751 Secondary polycythemia: Secondary | ICD-10-CM | POA: Diagnosis not present

## 2021-09-12 DIAGNOSIS — R9431 Abnormal electrocardiogram [ECG] [EKG]: Secondary | ICD-10-CM | POA: Diagnosis not present

## 2021-09-12 DIAGNOSIS — D751 Secondary polycythemia: Secondary | ICD-10-CM | POA: Diagnosis not present

## 2021-09-12 DIAGNOSIS — Z87891 Personal history of nicotine dependence: Secondary | ICD-10-CM | POA: Diagnosis not present

## 2021-09-12 DIAGNOSIS — Z0000 Encounter for general adult medical examination without abnormal findings: Secondary | ICD-10-CM | POA: Diagnosis not present

## 2021-12-17 ENCOUNTER — Encounter: Payer: Self-pay | Admitting: Gastroenterology

## 2022-01-10 ENCOUNTER — Ambulatory Visit (AMBULATORY_SURGERY_CENTER): Payer: Self-pay

## 2022-01-10 VITALS — Ht 72.0 in | Wt 175.0 lb

## 2022-01-10 DIAGNOSIS — Z86010 Personal history of colonic polyps: Secondary | ICD-10-CM

## 2022-01-10 MED ORDER — NA SULFATE-K SULFATE-MG SULF 17.5-3.13-1.6 GM/177ML PO SOLN: 1.0000 | Freq: Once | ORAL | 0 refills | Status: AC

## 2022-01-10 NOTE — Progress Notes (Signed)

## 2022-01-21 ENCOUNTER — Encounter: Payer: Self-pay | Admitting: Gastroenterology

## 2022-01-30 ENCOUNTER — Encounter: Payer: Self-pay | Admitting: Gastroenterology

## 2022-01-30 ENCOUNTER — Ambulatory Visit (AMBULATORY_SURGERY_CENTER): Payer: BC Managed Care – PPO | Admitting: Gastroenterology

## 2022-01-30 VITALS — BP 135/83 | HR 50 | Temp 97.8°F | Resp 12 | Ht 72.0 in | Wt 175.0 lb

## 2022-01-30 DIAGNOSIS — Z1211 Encounter for screening for malignant neoplasm of colon: Secondary | ICD-10-CM | POA: Diagnosis not present

## 2022-01-30 DIAGNOSIS — Z09 Encounter for follow-up examination after completed treatment for conditions other than malignant neoplasm: Secondary | ICD-10-CM

## 2022-01-30 DIAGNOSIS — Z86010 Personal history of colonic polyps: Secondary | ICD-10-CM | POA: Diagnosis not present

## 2022-01-30 DIAGNOSIS — D123 Benign neoplasm of transverse colon: Secondary | ICD-10-CM

## 2022-01-30 MED ORDER — SODIUM CHLORIDE 0.9 % IV SOLN: 500.0000 mL | Freq: Once | INTRAVENOUS | Status: DC

## 2022-01-30 NOTE — Progress Notes (Unsigned)
Report to PACU, RN, vss, BBS= Clear.  

## 2022-01-30 NOTE — Progress Notes (Signed)
Called to room to assist during endoscopic procedure.  Patient ID and intended procedure confirmed with present staff. Received instructions for my participation in the procedure from the performing physician.  

## 2022-01-30 NOTE — Patient Instructions (Signed)
Handout provided on polyps and hemorrhoids.   Resume previous diet. Continue present medications.  Await pathology results.  Repeat colonoscopy in 5-10 years for surveillance based on pathology results.   YOU HAD AN ENDOSCOPIC PROCEDURE TODAY AT [facility]:   Refer to the procedure report that was given to you for any specific questions about what was found during the examination.  If the procedure report does not answer your questions, please call your gastroenterologist to clarify.  If you requested that your care partner not be given the details of your procedure findings, then the procedure report has been included in a sealed envelope for you to review at your convenience later.  YOU SHOULD EXPECT: Some feelings of bloating in the abdomen. Passage of more gas than usual.  Walking can help get rid of the air that was put into your GI tract during the procedure and reduce the bloating. If you had a lower endoscopy (such as a colonoscopy or flexible sigmoidoscopy) you may notice spotting of blood in your stool or on the toilet paper. If you underwent a bowel prep for your procedure, you may not have a normal bowel movement for a few days.  Please Note:  You might notice some irritation and congestion in your nose or some drainage.  This is from the oxygen used during your procedure.  There is no need for concern and it should clear up in a day or so.  SYMPTOMS TO REPORT IMMEDIATELY:  Following lower endoscopy (colonoscopy or flexible sigmoidoscopy):  Excessive amounts of blood in the stool  Significant tenderness or worsening of abdominal pains  Swelling of the abdomen that is new, acute  Fever of 100F or higher  For urgent or emergent issues, a gastroenterologist can be reached at any hour by calling [phone]. Do not use MyChart messaging for urgent concerns.    DIET:  We do recommend a small meal at first, but then you may proceed to your regular diet.  Drink  plenty of fluids but you should avoid alcoholic beverages for 24 hours.  ACTIVITY:  You should plan to take it easy for the rest of today and you should NOT DRIVE or use heavy machinery until tomorrow (because of the sedation medicines used during the test).    FOLLOW UP: Our staff will call the number listed on your records the next business day following your procedure.  We will call around 7:15- 8:00 am to check on you and address any questions or concerns that you may have regarding the information given to you following your procedure. If we do not reach you, we will leave a message.     If any biopsies were taken you will be contacted by phone or by letter within the next 1-3 weeks.  Please call us at [phone] if you have not heard about the biopsies in 3 weeks.    SIGNATURES/CONFIDENTIALITY: You and/or your care partner have signed paperwork which will be entered into your electronic medical record.  These signatures attest to the fact that that the information above on your After Visit Summary has been reviewed and is understood.  Full responsibility of the confidentiality of this discharge information lies with you and/or your care-partner.

## 2022-01-30 NOTE — Progress Notes (Unsigned)
[facility] History and Physical   Primary Care Physician:  Patient, No Pcp Per   Reason for Procedure:  History of adenomatous colon polyps  Plan:    Surveillance colonoscopy with possible interventions as needed     HPI: Tanner Kane is a very pleasant 56 y.o. male here for surveillance colonoscopy. Denies any nausea, vomiting, abdominal pain, melena or bright red blood per rectum  The risks and benefits as well as alternatives of endoscopic procedure(s) have been discussed and reviewed. All questions answered. The patient agrees to proceed.    Past Medical History:  Diagnosis Date   Adenomatous colon polyp    Barrett esophagus    GERD (gastroesophageal reflux disease)    Hiatal hernia     Past Surgical History:  Procedure Laterality Date   COLONOSCOPY     UPPER GASTROINTESTINAL ENDOSCOPY     VASECTOMY      Prior to Admission medications   Medication Sig Start Date End Date Taking? Authorizing Provider  ibuprofen (ADVIL,MOTRIN) 800 MG tablet Take 1 tablet (800 mg total) by mouth every 8 (eight) hours as needed (with food). 12/07/14   Eula Listen, MD  levofloxacin (LEVAQUIN) 750 MG tablet Take 1 tablet (750 mg total) by mouth once. Patient not taking: Reported on 01/30/2022 12/07/14   Eula Listen, MD    Current Outpatient Medications  Medication Sig Dispense Refill   ibuprofen (ADVIL,MOTRIN) 800 MG tablet Take 1 tablet (800 mg total) by mouth every 8 (eight) hours as needed (with food). 20 tablet 0   levofloxacin (LEVAQUIN) 750 MG tablet Take 1 tablet (750 mg total) by mouth once. (Patient not taking: Reported on 01/30/2022) 14 tablet 0   Current Facility-Administered Medications  Medication Dose Route Frequency Provider Last Rate Last Admin   0.9 %  sodium chloride infusion  500 mL Intravenous Once Mauri Pole, MD        Allergies as of 01/30/2022 - Review Complete 01/30/2022  Allergen Reaction Noted   Aspirin Other  (See Comments)     Family History  Problem Relation Age of Onset   Heart disease Maternal Grandmother    Breast cancer Maternal Grandfather    Bone cancer Maternal Grandfather    Colon cancer Neg Hx    Esophageal cancer Neg Hx    Stomach cancer Neg Hx    Rectal cancer Neg Hx    Colon polyps Neg Hx     Social History   Socioeconomic History   Marital status: Married    Spouse name: Not on file   Number of children: Not on file   Years of education: Not on file   Highest education level: Not on file  Occupational History   Not on file  Tobacco Use   Smoking status: Former    Packs/day: 0.50    Types: Cigarettes   Smokeless tobacco: Never  Substance and Sexual Activity   Alcohol use: Yes    Comment: 3-4 drinks daily   Drug use: No   Sexual activity: Not on file  Other Topics Concern   Not on file  Social History Narrative   Not on file   Social Determinants of Health   Financial Resource Strain: Not on file  Food Insecurity: Not on file  Transportation Needs: Not on file  Physical Activity: Not on file  Stress: Not on file  Social Connections: Not on file  Intimate Partner Violence: Not on file    Review of Systems:  All other review of  systems negative except as mentioned in the HPI.  Physical Exam: Vital signs in last 24 hours: Blood Pressure 130/74   Pulse (Abnormal) 52   Temperature 97.8 F (36.6 C)   Height 6' (1.829 m)   Weight 175 lb (79.4 kg)   Oxygen Saturation 97%   Body Mass Index 23.73 kg/m  General:   Alert, NAD Lungs:  Clear .   Heart:  Regular rate and rhythm Abdomen:  Soft, nontender and nondistended. Neuro/Psych:  Alert and cooperative. Normal mood and affect. A and O x 3  Reviewed labs, radiology imaging, old records and pertinent past GI work up  Patient is appropriate for planned procedure(s) and anesthesia in an ambulatory setting   K. Denzil Magnuson , MD [phone]

## 2022-01-30 NOTE — Progress Notes (Signed)
Pt's states no medical or surgical changes since previsit or office visit. 

## 2022-01-30 NOTE — Op Note (Signed)
[place] Patient Name: Tanner Kane Procedure Date: 01/30/2022 1:30 PM MRN: [id number] Endoscopist: Mauri Pole , MD, [id number] Age: 56 Referring MD:  Date of Birth: 27-Oct-1965 Gender: Male Account #: [id number] Procedure:                Colonoscopy Indications:              High risk colon cancer surveillance: Personal                            history of colonic polyps, High risk colon cancer                            surveillance: Personal history of multiple (3 or                            more) adenomas, High risk colon cancer                            surveillance: Personal history of adenoma less than                            10 mm in size Medicines:                Monitored Anesthesia Care Procedure:                Pre-Anesthesia Assessment:                           - Prior to the procedure, a History and Physical                            was performed, and patient medications and                            allergies were reviewed. The patient's tolerance of                            previous anesthesia was also reviewed. The risks                            and benefits of the procedure and the sedation                            options and risks were discussed with the patient.                            All questions were answered, and informed consent                            was obtained. Prior Anticoagulants: The patient has                            taken no anticoagulant or antiplatelet agents. ASA  Grade Assessment: II - A patient with mild systemic                            disease. After reviewing the risks and benefits,                            the patient was deemed in satisfactory condition to                            undergo the procedure.                           After obtaining informed consent, the colonoscope                            was passed under direct vision. Throughout the                             procedure, the patient's blood pressure, pulse, and                            oxygen saturations were monitored continuously. The                            Olympus PCF-H190DL ([id number]) Colonoscope was                            introduced through the anus and advanced to the the                            cecum, identified by appendiceal orifice and                            ileocecal valve. The colonoscopy was performed                            without difficulty. The patient tolerated the                            procedure well. The quality of the bowel                            preparation was excellent. The ileocecal valve,                            appendiceal orifice, and rectum were photographed. Scope In: 1:34:02 PM Scope Out: 1:46:59 PM Scope Withdrawal Time: 0 hours 8 minutes 56 seconds  Total Procedure Duration: 0 hours 12 minutes 57 seconds  Findings:                 The perianal and digital rectal examinations were                            normal.  Two sessile polyps were found in the transverse                            colon. The polyps were 3 to 7 mm in size. These                            polyps were removed with a cold snare. Resection                            and retrieval were complete.                           Non-bleeding internal hemorrhoids were found during                            retroflexion. The hemorrhoids were large. Complications:            No immediate complications. Estimated Blood Loss:     Estimated blood loss was minimal. Impression:               - Two 3 to 7 mm polyps in the transverse colon,                            removed with a cold snare. Resected and retrieved.                           - Non-bleeding internal hemorrhoids. Recommendation:           - Patient has a contact number available for                            emergencies. The signs and symptoms of potential                             delayed complications were discussed with the                            patient. Return to normal activities tomorrow.                            Written discharge instructions were provided to the                            patient.                           - Resume previous diet.                           - Continue present medications.                           - Await pathology results.                           - Repeat colonoscopy in 5-10 years for surveillance  based on pathology results. Mauri Pole, MD 01/30/2022 1:51:25 PM This report has been signed electronically.

## 2022-01-31 ENCOUNTER — Telehealth: Payer: Self-pay

## 2022-01-31 NOTE — Telephone Encounter (Signed)
  Follow up Call-     01/30/2022    1:00 PM  Call back number  Post procedure Call Back phone  # [phone]  Permission to leave phone message Yes     Patient questions:  Do you have a fever, pain , or abdominal swelling? No. Pain Score  0 *  Have you tolerated food without any problems? Yes.    Have you been able to return to your normal activities? Yes.    Do you have any questions about your discharge instructions: Diet   No. Medications  No. Follow up visit  No.  Do you have questions or concerns about your Care? No.  Actions: * If pain score is 4 or above: No action needed, pain <4.

## 2022-02-21 ENCOUNTER — Encounter: Payer: Self-pay | Admitting: Gastroenterology

## 2022-09-18 DIAGNOSIS — Z0000 Encounter for general adult medical examination without abnormal findings: Secondary | ICD-10-CM | POA: Diagnosis not present

## 2022-09-18 DIAGNOSIS — Z125 Encounter for screening for malignant neoplasm of prostate: Secondary | ICD-10-CM | POA: Diagnosis not present

## 2022-09-18 DIAGNOSIS — D751 Secondary polycythemia: Secondary | ICD-10-CM | POA: Diagnosis not present

## 2022-09-25 DIAGNOSIS — Z0000 Encounter for general adult medical examination without abnormal findings: Secondary | ICD-10-CM | POA: Diagnosis not present

## 2022-09-25 DIAGNOSIS — Z87891 Personal history of nicotine dependence: Secondary | ICD-10-CM | POA: Diagnosis not present

## 2022-09-25 DIAGNOSIS — R9431 Abnormal electrocardiogram [ECG] [EKG]: Secondary | ICD-10-CM | POA: Diagnosis not present

## 2022-09-25 DIAGNOSIS — Z125 Encounter for screening for malignant neoplasm of prostate: Secondary | ICD-10-CM | POA: Diagnosis not present

## 2023-10-01 DIAGNOSIS — Z125 Encounter for screening for malignant neoplasm of prostate: Secondary | ICD-10-CM | POA: Diagnosis not present

## 2023-10-01 DIAGNOSIS — K22719 Barrett's esophagus with dysplasia, unspecified: Secondary | ICD-10-CM | POA: Diagnosis not present

## 2023-10-01 DIAGNOSIS — D751 Secondary polycythemia: Secondary | ICD-10-CM | POA: Diagnosis not present

## 2023-10-01 DIAGNOSIS — Z87891 Personal history of nicotine dependence: Secondary | ICD-10-CM | POA: Diagnosis not present

## 2023-10-01 DIAGNOSIS — R9431 Abnormal electrocardiogram [ECG] [EKG]: Secondary | ICD-10-CM | POA: Diagnosis not present

## 2023-10-01 DIAGNOSIS — Z0000 Encounter for general adult medical examination without abnormal findings: Secondary | ICD-10-CM | POA: Diagnosis not present

## 2023-10-08 DIAGNOSIS — D751 Secondary polycythemia: Secondary | ICD-10-CM | POA: Diagnosis not present

## 2023-10-08 DIAGNOSIS — R9431 Abnormal electrocardiogram [ECG] [EKG]: Secondary | ICD-10-CM | POA: Diagnosis not present

## 2023-10-08 DIAGNOSIS — Z87891 Personal history of nicotine dependence: Secondary | ICD-10-CM | POA: Diagnosis not present

## 2023-10-08 DIAGNOSIS — Z0000 Encounter for general adult medical examination without abnormal findings: Secondary | ICD-10-CM | POA: Diagnosis not present

## 2023-11-01 DIAGNOSIS — H7291 Unspecified perforation of tympanic membrane, right ear: Secondary | ICD-10-CM | POA: Diagnosis not present

## 2023-11-01 DIAGNOSIS — H6691 Otitis media, unspecified, right ear: Secondary | ICD-10-CM | POA: Diagnosis not present

## 2023-11-01 DIAGNOSIS — I10 Essential (primary) hypertension: Secondary | ICD-10-CM | POA: Diagnosis not present

## 2023-11-17 ENCOUNTER — Observation Stay
Admission: EM | Admit: 2023-11-17 | Discharge: 2023-11-18 | Disposition: A | Discharge status: Home / Self Care | Attending: Internal Medicine | Admitting: Internal Medicine

## 2023-11-17 ENCOUNTER — Other Ambulatory Visit: Payer: Self-pay

## 2023-11-17 DIAGNOSIS — F1010 Alcohol abuse, uncomplicated: Secondary | ICD-10-CM | POA: Diagnosis not present

## 2023-11-17 DIAGNOSIS — E876 Hypokalemia: Secondary | ICD-10-CM | POA: Diagnosis not present

## 2023-11-17 DIAGNOSIS — T63061A Toxic effect of venom of other North and South American snake, accidental (unintentional), initial encounter: Principal | ICD-10-CM | POA: Insufficient documentation

## 2023-11-17 DIAGNOSIS — W5911XA Bitten by nonvenomous snake, initial encounter: Secondary | ICD-10-CM | POA: Diagnosis not present

## 2023-11-17 DIAGNOSIS — T63001A Toxic effect of unspecified snake venom, accidental (unintentional), initial encounter: Secondary | ICD-10-CM | POA: Diagnosis not present

## 2023-11-17 DIAGNOSIS — W5911XS Bitten by nonvenomous snake, sequela: Secondary | ICD-10-CM | POA: Diagnosis not present

## 2023-11-17 DIAGNOSIS — Z87891 Personal history of nicotine dependence: Secondary | ICD-10-CM | POA: Insufficient documentation

## 2023-11-17 LAB — COMPREHENSIVE METABOLIC PANEL WITH GFR
ALT: 27 U/L (ref 0–44)
AST: 31 U/L (ref 15–41)
Albumin: 4 g/dL (ref 3.5–5.0)
Alkaline Phosphatase: 84 U/L (ref 38–126)
Anion gap: 10 (ref 5–15)
BUN: 11 mg/dL (ref 6–20)
CO2: 24 mmol/L (ref 22–32)
Calcium: 9.2 mg/dL (ref 8.9–10.3)
Chloride: 102 mmol/L (ref 98–111)
Creatinine, Ser: 0.94 mg/dL (ref 0.61–1.24)
GFR, Estimated: >60 mL/min (ref >60)
Glucose, Bld: 100 mg/dL — ABNORMAL HIGH (ref 70–99)
Potassium: 4 mmol/L (ref 3.5–5.1)
Sodium: 136 mmol/L (ref 135–145)
Total Bilirubin: 1.7 mg/dL — ABNORMAL HIGH (ref 0.0–1.2)
Total Protein: 6.8 g/dL (ref 6.5–8.1)

## 2023-11-17 LAB — PROTIME-INR
INR: 1 (ref 0.8–1.2)
INR: 1.1 (ref 0.8–1.2)
Prothrombin Time: 13.6 s (ref 11.4–15.2)
Prothrombin Time: 14.4 s (ref 11.4–15.2)

## 2023-11-17 LAB — CBC
HCT: 50.8 % (ref 39.0–52.0)
HCT: 49.3 % (ref 39.0–52.0)
Hemoglobin: 17.1 g/dL — ABNORMAL HIGH (ref 13.0–17.0)
Hemoglobin: 16.8 g/dL (ref 13.0–17.0)
MCH: 31.5 pg (ref 26.0–34.0)
MCH: 32.1 pg (ref 26.0–34.0)
MCHC: 33.7 g/dL (ref 30.0–36.0)
MCHC: 34.1 g/dL (ref 30.0–36.0)
MCV: 93.6 fL (ref 80.0–100.0)
MCV: 94.1 fL (ref 80.0–100.0)
Platelets: 286 K/uL (ref 150–400)
Platelets: 275 K/uL (ref 150–400)
RBC: 5.43 MIL/uL (ref 4.22–5.81)
RBC: 5.24 MIL/uL (ref 4.22–5.81)
RDW: 13.2 % (ref 11.5–15.5)
RDW: 13.2 % (ref 11.5–15.5)
WBC: 9.2 K/uL (ref 4.0–10.5)
WBC: 13.1 K/uL — ABNORMAL HIGH (ref 4.0–10.5)
nRBC: 0 % (ref 0.0–0.2)
nRBC: 0 % (ref 0.0–0.2)

## 2023-11-17 LAB — APTT: aPTT: 29 s (ref 24–36)

## 2023-11-17 LAB — CK: Total CK: 136 U/L (ref 49–397)

## 2023-11-17 LAB — FIBRINOGEN
Fibrinogen: 282 mg/dL (ref 210–475)
Fibrinogen: 270 mg/dL (ref 210–475)

## 2023-11-17 LAB — D-DIMER, QUANTITATIVE: D-Dimer, Quant: 0.42 ug{FEU}/mL (ref 0.00–0.50)

## 2023-11-17 MED ORDER — OXYCODONE HCL 5 MG PO TABS
5.0000 mg | ORAL_TABLET | ORAL | Status: DC | PRN
  Administered 2023-11-17 – 2023-11-18 (×3): 5 mg via ORAL
  Filled 2023-11-17 (×4): qty 1

## 2023-11-17 MED ORDER — LORAZEPAM 1 MG PO TABS: 1.0000 mg | ORAL_TABLET | ORAL | Status: DC | PRN

## 2023-11-17 MED ORDER — ORAL CARE MOUTH RINSE: 15.0000 mL | OROMUCOSAL | Status: DC | PRN

## 2023-11-17 MED ORDER — ACETAMINOPHEN 650 MG RE SUPP: 650.0000 mg | Freq: Four times a day (QID) | RECTAL | Status: DC | PRN

## 2023-11-17 MED ORDER — FOLIC ACID 1 MG PO TABS
1.0000 mg | ORAL_TABLET | Freq: Every day | ORAL | Status: DC
  Filled 2023-11-17: qty 1

## 2023-11-17 MED ORDER — LORAZEPAM 2 MG PO TABS: 0.0000 mg | ORAL_TABLET | Freq: Four times a day (QID) | ORAL | Status: DC

## 2023-11-17 MED ORDER — ADULT MULTIVITAMIN W/MINERALS CH
1.0000 | ORAL_TABLET | Freq: Every day | ORAL | Status: DC
Start: 2023-11-17 — End: 2023-11-18
  Filled 2023-11-17: qty 1

## 2023-11-17 MED ORDER — ACETAMINOPHEN 325 MG PO TABS: 650.0000 mg | ORAL_TABLET | Freq: Four times a day (QID) | ORAL | Status: DC | PRN

## 2023-11-17 MED ORDER — CALCIUM CARBONATE ANTACID 500 MG PO CHEW
1.0000 | CHEWABLE_TABLET | Freq: Three times a day (TID) | ORAL | Status: DC | PRN
  Administered 2023-11-17: 200 mg via ORAL
  Filled 2023-11-17: qty 1

## 2023-11-17 MED ORDER — LORAZEPAM 2 MG/ML IJ SOLN: 1.0000 mg | INTRAMUSCULAR | Status: DC | PRN

## 2023-11-17 MED ORDER — SODIUM CHLORIDE 0.9 % IV SOLN
4.0000 | Freq: Once | INTRAVENOUS | Status: AC
  Administered 2023-11-17: 4 via INTRAVENOUS
  Filled 2023-11-17: qty 72

## 2023-11-17 MED ORDER — TETANUS-DIPHTH-ACELL PERTUSSIS 5-2.5-18.5 LF-MCG/0.5 IM SUSY
0.5000 mL | PREFILLED_SYRINGE | Freq: Once | INTRAMUSCULAR | Status: DC
  Filled 2023-11-17: qty 0.5

## 2023-11-17 MED ORDER — LORAZEPAM 2 MG PO TABS: 0.0000 mg | ORAL_TABLET | Freq: Two times a day (BID) | ORAL | Status: DC

## 2023-11-17 MED ORDER — CROTALIDAE POLYVAL IMMUNE FAB IV SOLR
4.0000 | Freq: Once | INTRAVENOUS | Status: DC
  Filled 2023-11-17: qty 72

## 2023-11-17 MED ORDER — THIAMINE HCL 100 MG/ML IJ SOLN: 100.0000 mg | Freq: Every day | INTRAMUSCULAR | Status: DC

## 2023-11-17 MED ORDER — MORPHINE SULFATE (PF) 2 MG/ML IV SOLN
2.0000 mg | INTRAVENOUS | Status: DC | PRN
  Administered 2023-11-17 – 2023-11-18 (×2): 2 mg via INTRAVENOUS
  Filled 2023-11-17 (×2): qty 1

## 2023-11-17 MED ORDER — POLYETHYLENE GLYCOL 3350 17 G PO PACK: 17.0000 g | PACK | Freq: Every day | ORAL | Status: DC | PRN

## 2023-11-17 MED ORDER — THIAMINE MONONITRATE 100 MG PO TABS
100.0000 mg | ORAL_TABLET | Freq: Every day | ORAL | Status: DC
  Filled 2023-11-17: qty 1

## 2023-11-17 NOTE — Assessment & Plan Note (Signed)
 ER physician made the decision to give CroFab  secondary to some swelling of his finger and pain in the left axilla.  Nursing staff called poison control.  Recommendations from poison control is no ice, no compression, no steroid, no antibiotics.  Elevate site.  Not bending the elbow at 90 degree angle.  Measurements of the finger wrist and forearm.  Repeat CBC, INR PTT and fibrinogen  in 3 hours tetanus shot.  I will give pain control with IV and oral medications.

## 2023-11-17 NOTE — ED Notes (Signed)
 Advised nurse that patient has ready bed

## 2023-11-17 NOTE — ED Triage Notes (Signed)
 Pt to ED via POV from home. Pt was working in the garden and a baby copperhead bit his left pointer finger about PTA. Pt has snake in bag with him.

## 2023-11-17 NOTE — ED Notes (Signed)
 Poison control recommendations: No ice No compression No steroid No antibiotics Elevate site Not bending elbow at 90 degree angle Measurements for 1st three hours then hourly at bite site, wrist and FA   3hrs repeat CBC, INR, PT, fibrinogen   Tetanus shot

## 2023-11-17 NOTE — H&P (Signed)
 History and Physical    Patient: Tanner Kane FMW:[id number] DOB: [birth year] DOA: 11/17/2023 DOS: the patient was seen and examined on 11/17/2023 PCP: [facility], [facility]  Patient coming from: Home  Chief Complaint:  Chief Complaint  Patient presents with   Snake Bite   HPI: Tanner Kane is a 58 y.o. male with no past medical history.  He was out in his garden and he picked up a bucket and got bit by a small copperhead snake on the tip of left first index finger..  This happened around 12 noon today.  When he came in pain was about 6 out of 10 in intensity.  ER physician was just watching things initially.  Left finger little more swollen and patient develop some pain in his left armpit.  The decision was made to give CroFab .  I asked nursing staff to call poison control.  Recommendations are tetanus injection, measurements of finger wrist and forearm for swelling.  Elevation, no ice, no compression, no steroid, no antibiotic.  CBC, INR, PTT and fibrinogen  in 3 hours.  Hospitalist services were contacted for admission.  Of note.  Patient was recently on antibiotics for perforated eardrum and completed antibiotics.  Decreased hearing right ear secondary to perforated eardrum. Review of Systems: Review of Systems  Constitutional:  Negative for chills, fever and weight loss.  HENT:  Positive for hearing loss.   Eyes:  Negative for blurred vision.  Respiratory:  Negative for cough.   Cardiovascular:  Negative for chest pain.  Gastrointestinal:  Negative for abdominal pain, constipation, diarrhea, nausea and vomiting.  Genitourinary:  Negative for dysuria.  Musculoskeletal:  Positive for joint pain.  Skin:  Negative for rash.  Neurological:  Negative for dizziness.  Endo/Heme/Allergies:  Does not bruise/bleed easily.  Psychiatric/Behavioral:  Negative for depression.     Past Medical History:  Diagnosis Date   Adenomatous colon polyp    Barrett esophagus    GERD  (gastroesophageal reflux disease)    Hiatal hernia    Past Surgical History:  Procedure Laterality Date   COLONOSCOPY     UPPER GASTROINTESTINAL ENDOSCOPY     VASECTOMY     Social History:  reports that he has quit smoking. His smoking use included cigarettes. He has never used smokeless tobacco. He reports that he does not currently use alcohol after a past usage of about 28.0 standard drinks of alcohol per week. He reports that he does not use drugs.  Allergies  Allergen Reactions   Aspirin Other (See Comments)    thins blood,nose bleeds    Family History  Problem Relation Age of Onset   Pancreatic cancer Mother    Breast cancer Sister    Heart disease Maternal Grandmother    Breast cancer Maternal Grandfather    Bone cancer Maternal Grandfather    Colon cancer Neg Hx    Esophageal cancer Neg Hx    Stomach cancer Neg Hx    Rectal cancer Neg Hx    Colon polyps Neg Hx     Prior to Admission medications   Medication Sig Start Date End Date Taking? Authorizing Provider  ibuprofen  (ADVIL ,MOTRIN ) 800 MG tablet Take 1 tablet (800 mg total) by mouth every 8 (eight) hours as needed (with food). 12/07/14   Pasco Alexandria, MD    Physical Exam: Vitals:   11/17/23 1235 11/17/23 1236 11/17/23 1340 11/17/23 1400  BP: (!) 165/85  (!) 168/92 (!) 155/87  Pulse: 61  (!) 48 66  Resp:  18  18 18   Temp:  98.8 F (37.1 C)    TempSrc: Oral     SpO2: 100%  98% 97%   Physical Exam HENT:     Head: Normocephalic.  Eyes:     General: Lids are normal.     Conjunctiva/sclera: Conjunctivae normal.  Cardiovascular:     Rate and Rhythm: Normal rate and regular rhythm.     Heart sounds: Normal heart sounds, S1 normal and S2 normal.  Pulmonary:     Breath sounds: No decreased breath sounds, wheezing, rhonchi or rales.  Abdominal:     Palpations: Abdomen is soft.     Tenderness: There is no abdominal tenderness.  Musculoskeletal:     Right lower leg: No swelling.     Left lower  leg: No swelling.  Lymphadenopathy:     Comments: Painful palpating in left axilla  Skin:    Comments: Left first finger a little swollen little red.  Neurological:     Mental Status: He is alert and oriented to person, place, and time.     Data Reviewed: Glucose 100, creatinine 0.94, electrolytes normal range, total bilirubin 1.7, CK1 36 white blood cell count 9.2, hemoglobin 17.1, platelet count 286, D-dimer 0.42, fibrinogen  282, PT 13.6, INR 1.0, PTT 29.  Assessment and Plan: * Snake bite, initial encounter ER physician made the decision to give CroFab  secondary to some swelling of his finger and pain in the left axilla.  Nursing staff called poison control.  Recommendations from poison control is no ice, no compression, no steroid, no antibiotics.  Elevate site.  Not bending the elbow at 90 degree angle.  Measurements of the finger wrist and forearm.  Repeat CBC, INR PTT and fibrinogen  in 3 hours tetanus shot.  I will give pain control with IV and oral medications.  Alcohol abuse Will put on alcohol withdrawal protocol      Advance Care Planning:   Code Status: Full Code   Consults: Poison control  Family Communication: Wife at bedside  Severity of Illness: The appropriate patient status for this patient is OBSERVATION. Observation status is judged to be reasonable and necessary in order to provide the required intensity of service to ensure the patient's safety. The patient's presenting symptoms, physical exam findings, and initial radiographic and laboratory data in the context of their medical condition is felt to place them at decreased risk for further clinical deterioration. Furthermore, it is anticipated that the patient will be medically stable for discharge from the hospital within 2 midnights of admission.   Author: Charlie Patterson, MD 11/17/2023 3:46 PM  For on call review [email or web address].

## 2023-11-17 NOTE — ED Provider Notes (Signed)
 [facility] Provider Note    Event Date/Time   First MD Initiated Contact with Patient 11/17/23 1235     (approximate)  History   Chief Complaint: Snake Bite  HPI  Tanner Kane is a 58 y.o. male with a past medical history of gastric reflux, presents the emergency department for a snakebite.  According to the patient he was working in his garden and got bit on the left index finger by a baby copperhead.  He then killed the copperhead and brought the copperhead in a Ziploc bag for confirmation.  Snake appears consistent with a copperhead, juvenile.  Patient has 1 puncture wound to the tip of the left index finger with a small amount of bruising to the area.  No significant swelling proximally as of yet however the bite occurred approximately 40 minutes ago.    Physical Exam   Triage Vital Signs: ED Triage Vitals  Encounter Vitals Group     BP --      Girls Systolic BP Percentile --      Girls Diastolic BP Percentile --      Boys Systolic BP Percentile --      Boys Diastolic BP Percentile --      Pulse Rate 11/17/23 1235 61     Resp 11/17/23 1235 18     Temp 11/17/23 1236 98.8 F (37.1 C)     Temp Source 11/17/23 1235 Oral     SpO2 --      Weight --      Height --      Head Circumference --      Peak Flow --      Pain Score 11/17/23 1235 5     Pain Loc --      Pain Education --      Exclude from Growth Chart --     Most recent vital signs: Vitals:   11/17/23 1235 11/17/23 1236  Pulse: 61   Resp: 18   Temp:  98.8 F (37.1 C)    General: Awake, no distress.  CV:  Good peripheral perfusion.  Resp:  Normal effort.   Abd:  No distention.   Other:  Patient has 1 puncture wound to the distal portion of his left index finger with some mild surrounding bruising and some mild swelling to the very tip of the index finger.   ED Results / Procedures / Treatments    MEDICATIONS ORDERED IN ED: Medications - No data to display   IMPRESSION  / MDM / ASSESSMENT AND PLAN / ED COURSE  I reviewed the triage vital signs and the nursing notes.  Patient's presentation is most consistent with acute presentation with potential threat to life or bodily function.  Patient presents emergency department after a copperhead bite to his left index finger.  Patient has the dead copperhead in a bag, I have confirmed its a copperhead.  Patient has very minimal swelling of the left index finger where he has what appears to be 1 puncture wound and some mild bruising to the tip.  Patient states mild pain to the area.  Discussed with the patient options of CroFab  and admission versus waiting and watching to see if there is increased swelling consistent with an envenomation.  Patient strongly wishes to go home if at all possible he is agreeable to stay and let us  watch the area.  After 2 hours the swelling has unfortunately increased and is now including the majority of his index  finger and he is now experiencing some discomfort in the left axilla as well.  Given the patient's worsening pain, increased swelling and now extension likely through the lymphatics with pain in his axilla we will start the patient on CroFab  I have ordered 4 vials.  Lab work is largely nonrevealing with a normal CBC chemistry LFTs normal CK normal coags and D-dimer.  FINAL CLINICAL IMPRESSION(S) / ED DIAGNOSES   Copperhead bite Snake bite   Note:  This document was prepared using Dragon voice recognition software and may include unintentional dictation errors.   Dorothyann Drivers, MD 11/17/23 1453

## 2023-11-17 NOTE — ED Notes (Addendum)
 Redness marked with skin marker at this time. No measurements necessary per MD.

## 2023-11-17 NOTE — ED Notes (Addendum)
 Increased Crofab  to 243mL/hr

## 2023-11-17 NOTE — ED Notes (Signed)
 Measurements below that were taken at 1530: 1st knuckle - 7cm Wrist - 18.5cm Forearm - 26.5cm

## 2023-11-17 NOTE — Plan of Care (Signed)
  Problem: Education: Goal: Knowledge of General Education information will improve Description: Including pain rating scale, medication(s)/side effects and non-pharmacologic comfort measures Outcome: Progressing   Problem: Clinical Measurements: Goal: Ability to maintain clinical measurements within normal limits will improve Outcome: Progressing Goal: Will remain free from infection Outcome: Progressing Goal: Cardiovascular complication will be avoided Outcome: Progressing

## 2023-11-17 NOTE — Progress Notes (Signed)
 Patient received tetanus injection within 2 years and will cancel order.

## 2023-11-17 NOTE — Assessment & Plan Note (Signed)
 No signs of withdrawal

## 2023-11-18 DIAGNOSIS — F1010 Alcohol abuse, uncomplicated: Secondary | ICD-10-CM | POA: Diagnosis not present

## 2023-11-18 DIAGNOSIS — W5911XS Bitten by nonvenomous snake, sequela: Secondary | ICD-10-CM

## 2023-11-18 DIAGNOSIS — W5911XA Bitten by nonvenomous snake, initial encounter: Secondary | ICD-10-CM | POA: Diagnosis not present

## 2023-11-18 DIAGNOSIS — E876 Hypokalemia: Secondary | ICD-10-CM | POA: Insufficient documentation

## 2023-11-18 LAB — CBC
HCT: 50.5 % (ref 39.0–52.0)
Hemoglobin: 17 g/dL (ref 13.0–17.0)
MCH: 31.7 pg (ref 26.0–34.0)
MCHC: 33.7 g/dL (ref 30.0–36.0)
MCV: 94.2 fL (ref 80.0–100.0)
Platelets: 260 K/uL (ref 150–400)
RBC: 5.36 MIL/uL (ref 4.22–5.81)
RDW: 13.1 % (ref 11.5–15.5)
WBC: 9.4 K/uL (ref 4.0–10.5)
nRBC: 0 % (ref 0.0–0.2)

## 2023-11-18 LAB — BASIC METABOLIC PANEL WITH GFR
Anion gap: 8 (ref 5–15)
BUN: 12 mg/dL (ref 6–20)
CO2: 25 mmol/L (ref 22–32)
Calcium: 8.8 mg/dL — ABNORMAL LOW (ref 8.9–10.3)
Chloride: 105 mmol/L (ref 98–111)
Creatinine, Ser: 0.85 mg/dL (ref 0.61–1.24)
GFR, Estimated: >60 mL/min (ref >60)
Glucose, Bld: 101 mg/dL — ABNORMAL HIGH (ref 70–99)
Potassium: 3.4 mmol/L — ABNORMAL LOW (ref 3.5–5.1)
Sodium: 138 mmol/L (ref 135–145)

## 2023-11-18 LAB — PROTIME-INR
INR: 1.1 (ref 0.8–1.2)
Prothrombin Time: 14.6 s (ref 11.4–15.2)

## 2023-11-18 LAB — HIV ANTIBODY (ROUTINE TESTING W REFLEX): HIV Screen 4th Generation wRfx: NONREACTIVE

## 2023-11-18 LAB — FIBRINOGEN: Fibrinogen: 310 mg/dL (ref 210–475)

## 2023-11-18 MED ORDER — POLYETHYLENE GLYCOL 3350 17 G PO PACK: 17.0000 g | PACK | Freq: Every day | ORAL | 0 refills | Status: AC | PRN

## 2023-11-18 MED ORDER — POTASSIUM CHLORIDE CRYS ER 20 MEQ PO TBCR
20.0000 meq | EXTENDED_RELEASE_TABLET | Freq: Once | ORAL | Status: AC
  Administered 2023-11-18: 20 meq via ORAL
  Filled 2023-11-18: qty 1

## 2023-11-18 MED ORDER — OXYCODONE HCL 5 MG PO TABS: 5.0000 mg | ORAL_TABLET | Freq: Four times a day (QID) | ORAL | 0 refills | Status: AC | PRN

## 2023-11-18 MED ORDER — ACETAMINOPHEN 325 MG PO TABS: 650.0000 mg | ORAL_TABLET | Freq: Four times a day (QID) | ORAL | Status: AC | PRN

## 2023-11-18 NOTE — Discharge Summary (Signed)
 Physician Discharge Summary   Patient: Tanner Kane MRN: [id number] DOB: 01/12/1966  Admit date:     11/17/2023  Discharge date: 11/18/23  Discharge Physician: Charlie Patterson   PCP: [facility]   Recommendations at discharge:   Follow-up PCP 5 days  Discharge Diagnoses: Principal Problem:   Snake bite Active Problems:   Alcohol abuse   Hypokalemia  Resolved Problems:   * No resolved hospital problems. *  Hospital Course: 58 y.o. male with no past medical history.  He was out in his garden and he picked up a bucket and got bit by a small copperhead snake on the tip of left first index finger..  This happened around 12 noon today.  When he came in pain was about 6 out of 10 in intensity.  ER physician was just watching things initially.  Left finger little more swollen and patient develop some pain in his left armpit.  The decision was made to give CroFab .  I asked nursing staff to call poison control.  Recommendations are tetanus injection, measurements of finger wrist and forearm for swelling.  Elevation, no ice, no compression, no steroid, no antibiotic.  Hospitalist services were contacted for admission   9/2.  Patient feels well and wants to go home.  Pain control with oral medications.  Note for work given.  Assessment and Plan: * Snake bite ER physician made the decision to give CroFab  secondary to some swelling of his finger and pain in the left axilla on 9/1.  Nursing staff called poison control.  Recommendations from poison control is no ice, no compression, no steroid, no antibiotics.  Elevate site.  Not bending the elbow at 90 degree angle.  Patient received a tetanus shot within 2 years.  I personally measured his finger slightly over 7 cm, at the wrist 18 cm at the forearm 26 and three-quarter centimeters.  At the forearm slightly larger and at the first knuckle slightly larger.  Patient feels well enough to go home.  He will elevate his arm.  Note  for work given.  Hypokalemia Replace potassium x 1  Alcohol abuse No signs of withdrawal         Consultants: None Procedures performed: None Disposition: Home Diet recommendation:  Regular diet DISCHARGE MEDICATION: Allergies as of 11/18/2023       Reactions   Aspirin Other (See Comments)   thins blood,nose bleeds        Medication List     STOP taking these medications    amoxicillin 875 MG tablet Commonly known as: AMOXIL   ciprofloxacin -dexamethasone OTIC suspension Commonly known as: CIPRODEX       TAKE these medications    acetaminophen  325 MG tablet Commonly known as: TYLENOL  Take 2 tablets (650 mg total) by mouth every 6 (six) hours as needed for mild pain (pain score 1-3) or fever (or Fever >/= 101).   ibuprofen  800 MG tablet Commonly known as: ADVIL  Take 1 tablet (800 mg total) by mouth every 8 (eight) hours as needed (with food).   oxyCODONE  5 MG immediate release tablet Commonly known as: Oxy IR/ROXICODONE  Take 1 tablet (5 mg total) by mouth every 6 (six) hours as needed for severe pain (pain score 7-10).   polyethylene glycol 17 g packet Commonly known as: MIRALAX  / GLYCOLAX  Take 17 g by mouth daily as needed for mild constipation.        Follow-up Information     [facility] [employer] Schedule an appointment  as soon as possible for a visit in 5 day(s).   Why: please request an appoitnment through your patient portal and the doctor will get back to you with an available time Contact information: [address] [place] [address] [phone]                Discharge Exam: Fredricka Weights   11/17/23 1622  Weight: 81.6 kg   Physical Exam HENT:     Head: Normocephalic.     Mouth/Throat:     Pharynx: No oropharyngeal exudate.  Eyes:     General: Lids are normal.     Conjunctiva/sclera: Conjunctivae normal.  Cardiovascular:     Rate and Rhythm: Normal rate and regular rhythm.     Heart sounds: Normal  heart sounds, S1 normal and S2 normal.  Pulmonary:     Breath sounds: No decreased breath sounds, wheezing, rhonchi or rales.  Abdominal:     Palpations: Abdomen is soft.     Tenderness: There is no abdominal tenderness.  Musculoskeletal:     Right lower leg: No swelling.     Left lower leg: No swelling.  Skin:    General: Skin is warm.     Findings: No rash.     Comments: Left first fingertip area of bruising around the site of the bite and surrounding area  Neurological:     Mental Status: He is alert and oriented to person, place, and time.      Condition at discharge: stable  The results of significant diagnostics from this hospitalization (including imaging, microbiology, ancillary and laboratory) are listed below for reference.   Imaging Studies: No results found.  Microbiology: No results found for this or any previous visit.  Labs: CBC: Recent Labs  Lab 11/17/23 1306 11/17/23 1834 11/18/23 0400  WBC 9.2 13.1* 9.4  HGB 17.1* 16.8 17.0  HCT 50.8 49.3 50.5  MCV 93.6 94.1 94.2  PLT 286 275 260   Basic Metabolic Panel: Recent Labs  Lab 11/17/23 1306 11/18/23 0400  NA 136 138  K 4.0 3.4*  CL 102 105  CO2 24 25  GLUCOSE 100* 101*  BUN 11 12  CREATININE 0.94 0.85  CALCIUM  9.2 8.8*   Liver Function Tests: Recent Labs  Lab 11/17/23 1306  AST 31  ALT 27  ALKPHOS 84  BILITOT 1.7*  PROT 6.8  ALBUMIN 4.0   CBG: No results for input(s): GLUCAP in the last 168 hours.  Discharge time spent: greater than 30 minutes.  Signed: Charlie Patterson, MD [facility] 11/18/2023

## 2023-11-18 NOTE — Hospital Course (Signed)
 58 y.o. male with no past medical history.  He was out in his garden and he picked up a bucket and got bit by a small copperhead snake on the tip of left first index finger..  This happened around 12 noon today.  When he came in pain was about 6 out of 10 in intensity.  ER physician was just watching things initially.  Left finger little more swollen and patient develop some pain in his left armpit.  The decision was made to give CroFab .  I asked nursing staff to call poison control.  Recommendations are tetanus injection, measurements of finger wrist and forearm for swelling.  Elevation, no ice, no compression, no steroid, no antibiotic.  Hospitalist services were contacted for admission   9/2.  Patient feels well and wants to go home.  Pain control with oral medications.  Note for work given.

## 2023-11-18 NOTE — TOC CM/SW Note (Signed)
 Transition of Care [facility]) - Inpatient Brief Assessment   Patient Details  Name: Tanner Kane MRN: [id number] Date of Birth: Jul 19, 1965  Transition of Care [facility]) CM/SW Contact:    Corean ONEIDA Haddock, RN Phone Number: 11/18/2023, 9:19 AM   Clinical Narrative:   Transition of Care [facility]) Screening Note   Patient Details  Name: Tanner Kane Date of Birth: Aug 04, 1965   Transition of Care [facility]) CM/SW Contact:    Corean ONEIDA Haddock, RN Phone Number: 11/18/2023, 9:19 AM    Transition of Care Department [facility]) has reviewed patient and no TOC needs have been identified at this time. . If new patient transition needs arise, please place a TOC consult.    Transition of Care Asessment: Insurance and Status: Insurance coverage has been reviewed Patient has primary care physician: Yes     Prior/Current Home Services: No current home services Social Drivers of Health Review: SDOH reviewed no interventions necessary Readmission risk has been reviewed: Yes Transition of care needs: no transition of care needs at this time

## 2023-11-18 NOTE — Assessment & Plan Note (Signed)
 Replace potassium x 1

## 2023-11-18 NOTE — Discharge Instructions (Signed)
 Elevate arm.  Try not to bend elbow more than 90 degres. No Ice, No Compression, No antibiotics

## 2023-12-10 DIAGNOSIS — H7201 Central perforation of tympanic membrane, right ear: Secondary | ICD-10-CM | POA: Diagnosis not present

## 2023-12-10 DIAGNOSIS — H903 Sensorineural hearing loss, bilateral: Secondary | ICD-10-CM | POA: Diagnosis not present
